# Patient Record
Sex: Female | Born: 1985 | Race: Black or African American | Hispanic: No | Marital: Single | Smoking: Former smoker
Health system: Southern US, Community
[De-identification: ages and names within clinical notes are randomized; demographics above are authoritative.]

## PROBLEM LIST (undated history)

## (undated) DIAGNOSIS — I472 Ventricular tachycardia, unspecified: Secondary | ICD-10-CM

## (undated) DIAGNOSIS — R87619 Unspecified abnormal cytological findings in specimens from cervix uteri: Secondary | ICD-10-CM

## (undated) DIAGNOSIS — R102 Pelvic and perineal pain: Secondary | ICD-10-CM

## (undated) DIAGNOSIS — IMO0002 Reserved for concepts with insufficient information to code with codable children: Secondary | ICD-10-CM

## (undated) DIAGNOSIS — B009 Herpesviral infection, unspecified: Secondary | ICD-10-CM

## (undated) DIAGNOSIS — G43909 Migraine, unspecified, not intractable, without status migrainosus: Secondary | ICD-10-CM

## (undated) DIAGNOSIS — K219 Gastro-esophageal reflux disease without esophagitis: Secondary | ICD-10-CM

## (undated) DIAGNOSIS — N83209 Unspecified ovarian cyst, unspecified side: Secondary | ICD-10-CM

## (undated) DIAGNOSIS — A749 Chlamydial infection, unspecified: Secondary | ICD-10-CM

## (undated) HISTORY — DX: Migraine, unspecified, not intractable, without status migrainosus: G43.909

## (undated) HISTORY — DX: Herpesviral infection, unspecified: B00.9

## (undated) HISTORY — DX: Ventricular tachycardia: I47.2

## (undated) HISTORY — DX: Pelvic and perineal pain: R10.2

## (undated) HISTORY — DX: Ventricular tachycardia, unspecified: I47.20

## (undated) HISTORY — DX: Gastro-esophageal reflux disease without esophagitis: K21.9

## (undated) HISTORY — DX: Reserved for concepts with insufficient information to code with codable children: IMO0002

## (undated) HISTORY — DX: Unspecified abnormal cytological findings in specimens from cervix uteri: R87.619

## (undated) HISTORY — PX: MOUTH SURGERY: SHX715

---
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
## 2007-11-20 ENCOUNTER — Emergency Department (HOSPITAL_COMMUNITY): Admission: EM | Admit: 2007-11-20 | Discharge: 2007-11-20 | Payer: Self-pay | Admitting: Emergency Medicine

## 2007-12-24 ENCOUNTER — Emergency Department (HOSPITAL_COMMUNITY): Admission: EM | Admit: 2007-12-24 | Discharge: 2007-12-24 | Payer: Self-pay | Admitting: Emergency Medicine

## 2008-02-26 ENCOUNTER — Emergency Department (HOSPITAL_COMMUNITY): Admission: EM | Admit: 2008-02-26 | Discharge: 2008-02-26 | Payer: Self-pay | Admitting: Physician Assistant

## 2008-03-10 ENCOUNTER — Emergency Department (HOSPITAL_COMMUNITY): Admission: EM | Admit: 2008-03-10 | Discharge: 2008-03-10 | Payer: Self-pay | Admitting: Emergency Medicine

## 2008-03-17 ENCOUNTER — Emergency Department (HOSPITAL_COMMUNITY): Admission: EM | Admit: 2008-03-17 | Discharge: 2008-03-18 | Payer: Self-pay | Admitting: Emergency Medicine

## 2008-03-25 ENCOUNTER — Inpatient Hospital Stay (HOSPITAL_COMMUNITY): Admission: AD | Admit: 2008-03-25 | Discharge: 2008-03-25 | Payer: Self-pay | Admitting: Obstetrics & Gynecology

## 2008-04-21 ENCOUNTER — Inpatient Hospital Stay (HOSPITAL_COMMUNITY): Admission: AD | Admit: 2008-04-21 | Discharge: 2008-04-21 | Payer: Self-pay | Admitting: Obstetrics

## 2008-05-08 ENCOUNTER — Inpatient Hospital Stay (HOSPITAL_COMMUNITY): Admission: AD | Admit: 2008-05-08 | Discharge: 2008-05-08 | Payer: Self-pay | Admitting: Obstetrics

## 2008-05-14 ENCOUNTER — Inpatient Hospital Stay (HOSPITAL_COMMUNITY): Admission: AD | Admit: 2008-05-14 | Discharge: 2008-05-14 | Payer: Self-pay | Admitting: Obstetrics

## 2008-05-19 ENCOUNTER — Inpatient Hospital Stay (HOSPITAL_COMMUNITY): Admission: AD | Admit: 2008-05-19 | Discharge: 2008-05-19 | Payer: Self-pay | Admitting: Obstetrics

## 2008-06-10 ENCOUNTER — Inpatient Hospital Stay (HOSPITAL_COMMUNITY): Admission: AD | Admit: 2008-06-10 | Discharge: 2008-06-10 | Payer: Self-pay | Admitting: Obstetrics

## 2008-07-27 ENCOUNTER — Ambulatory Visit (HOSPITAL_COMMUNITY): Admission: RE | Admit: 2008-07-27 | Discharge: 2008-07-27 | Payer: Self-pay | Admitting: Obstetrics

## 2008-08-13 ENCOUNTER — Inpatient Hospital Stay (HOSPITAL_COMMUNITY): Admission: AD | Admit: 2008-08-13 | Discharge: 2008-08-13 | Payer: Self-pay | Admitting: Obstetrics

## 2008-09-13 ENCOUNTER — Inpatient Hospital Stay (HOSPITAL_COMMUNITY): Admission: AD | Admit: 2008-09-13 | Discharge: 2008-09-13 | Payer: Self-pay | Admitting: Obstetrics

## 2008-09-18 ENCOUNTER — Ambulatory Visit: Payer: Self-pay | Admitting: Obstetrics and Gynecology

## 2008-09-18 ENCOUNTER — Inpatient Hospital Stay (HOSPITAL_COMMUNITY): Admission: AD | Admit: 2008-09-18 | Discharge: 2008-09-18 | Payer: Self-pay | Admitting: Obstetrics

## 2008-10-11 ENCOUNTER — Ambulatory Visit: Payer: Self-pay | Admitting: Advanced Practice Midwife

## 2008-10-11 ENCOUNTER — Inpatient Hospital Stay (HOSPITAL_COMMUNITY): Admission: AD | Admit: 2008-10-11 | Discharge: 2008-10-11 | Payer: Self-pay | Admitting: Obstetrics

## 2008-10-12 ENCOUNTER — Inpatient Hospital Stay (HOSPITAL_COMMUNITY): Admission: AD | Admit: 2008-10-12 | Discharge: 2008-10-12 | Payer: Self-pay | Admitting: Obstetrics

## 2008-10-21 ENCOUNTER — Inpatient Hospital Stay (HOSPITAL_COMMUNITY): Admission: AD | Admit: 2008-10-21 | Discharge: 2008-10-21 | Payer: Self-pay | Admitting: Obstetrics

## 2008-11-23 ENCOUNTER — Inpatient Hospital Stay (HOSPITAL_COMMUNITY): Admission: RE | Admit: 2008-11-23 | Discharge: 2008-11-25 | Payer: Self-pay | Admitting: Obstetrics

## 2009-03-11 ENCOUNTER — Emergency Department (HOSPITAL_COMMUNITY): Admission: EM | Admit: 2009-03-11 | Discharge: 2009-03-11 | Payer: Self-pay | Admitting: Emergency Medicine

## 2009-04-11 ENCOUNTER — Emergency Department (HOSPITAL_COMMUNITY): Admission: EM | Admit: 2009-04-11 | Discharge: 2009-04-11 | Payer: Self-pay | Admitting: Emergency Medicine

## 2009-08-05 ENCOUNTER — Emergency Department (HOSPITAL_COMMUNITY): Admission: EM | Admit: 2009-08-05 | Discharge: 2009-08-05 | Payer: Self-pay | Admitting: Emergency Medicine

## 2009-08-18 ENCOUNTER — Emergency Department (HOSPITAL_COMMUNITY): Admission: EM | Admit: 2009-08-18 | Discharge: 2009-08-18 | Payer: Self-pay | Admitting: Emergency Medicine

## 2009-09-21 ENCOUNTER — Inpatient Hospital Stay (HOSPITAL_COMMUNITY): Admission: AD | Admit: 2009-09-21 | Discharge: 2009-09-21 | Payer: Self-pay | Admitting: Obstetrics & Gynecology

## 2009-11-24 ENCOUNTER — Inpatient Hospital Stay (HOSPITAL_COMMUNITY): Admission: AD | Admit: 2009-11-24 | Discharge: 2009-11-24 | Payer: Self-pay | Admitting: Obstetrics and Gynecology

## 2010-02-18 ENCOUNTER — Emergency Department (HOSPITAL_COMMUNITY): Admission: EM | Admit: 2010-02-18 | Discharge: 2010-02-18 | Payer: Self-pay | Admitting: Emergency Medicine

## 2010-07-25 ENCOUNTER — Inpatient Hospital Stay (HOSPITAL_COMMUNITY)
Admission: AD | Admit: 2010-07-25 | Discharge: 2010-07-26 | Disposition: A | Discharge status: Home / Self Care | Payer: Self-pay | Source: Ambulatory Visit | Attending: Obstetrics & Gynecology | Admitting: Obstetrics & Gynecology

## 2010-07-25 DIAGNOSIS — N949 Unspecified condition associated with female genital organs and menstrual cycle: Secondary | ICD-10-CM

## 2010-07-25 DIAGNOSIS — B3731 Acute candidiasis of vulva and vagina: Secondary | ICD-10-CM | POA: Insufficient documentation

## 2010-07-25 DIAGNOSIS — B373 Candidiasis of vulva and vagina: Secondary | ICD-10-CM

## 2010-07-25 LAB — WET PREP, GENITAL: Yeast Wet Prep HPF POC: NONE SEEN

## 2010-07-25 LAB — CBC
MCH: 30.9 pg (ref 26.0–34.0)
Platelets: 263 10*3/uL (ref 150–400)

## 2010-07-25 LAB — POCT PREGNANCY, URINE: Preg Test, Ur: NEGATIVE

## 2010-07-26 LAB — GC/CHLAMYDIA PROBE AMP, GENITAL: GC Probe Amp, Genital: NEGATIVE

## 2010-07-27 LAB — POCT URINALYSIS DIPSTICK
Glucose, UA: NEGATIVE mg/dL
Hgb urine dipstick: NEGATIVE
Ketones, ur: NEGATIVE mg/dL
Specific Gravity, Urine: 1.01 (ref 1.005–1.030)

## 2010-07-27 LAB — POCT PREGNANCY, URINE: Preg Test, Ur: NEGATIVE

## 2010-08-01 LAB — URINALYSIS, ROUTINE W REFLEX MICROSCOPIC
Glucose, UA: NEGATIVE mg/dL
Nitrite: NEGATIVE
Specific Gravity, Urine: >1.03 — ABNORMAL HIGH (ref 1.005–1.030)
pH: 6 (ref 5.0–8.0)

## 2010-08-01 LAB — GC/CHLAMYDIA PROBE AMP, GENITAL
Chlamydia, DNA Probe: NEGATIVE
GC Probe Amp, Genital: NEGATIVE

## 2010-08-01 LAB — CBC
HCT: 37.3 % (ref 36.0–46.0)
MCHC: 34.4 g/dL (ref 30.0–36.0)
MCV: 93 fL (ref 78.0–100.0)
WBC: 4.8 10*3/uL (ref 4.0–10.5)

## 2010-08-01 LAB — WET PREP, GENITAL
Clue Cells Wet Prep HPF POC: NONE SEEN
Trich, Wet Prep: NONE SEEN
Yeast Wet Prep HPF POC: NONE SEEN

## 2010-08-01 LAB — URINE MICROSCOPIC-ADD ON

## 2010-08-02 LAB — RAPID STREP SCREEN (MED CTR MEBANE ONLY): Streptococcus, Group A Screen (Direct): NEGATIVE

## 2010-08-06 LAB — WET PREP, GENITAL

## 2010-08-06 LAB — POCT PREGNANCY, URINE: Preg Test, Ur: NEGATIVE

## 2010-08-06 LAB — URINALYSIS, ROUTINE W REFLEX MICROSCOPIC
Nitrite: NEGATIVE
Protein, ur: NEGATIVE mg/dL
Urobilinogen, UA: 0.2 mg/dL (ref 0.0–1.0)

## 2010-08-12 ENCOUNTER — Emergency Department (HOSPITAL_COMMUNITY)
Admission: EM | Admit: 2010-08-12 | Discharge: 2010-08-12 | Disposition: A | Discharge status: Home / Self Care | Payer: Self-pay | Attending: Emergency Medicine | Admitting: Emergency Medicine

## 2010-08-12 DIAGNOSIS — J3489 Other specified disorders of nose and nasal sinuses: Secondary | ICD-10-CM | POA: Insufficient documentation

## 2010-08-12 DIAGNOSIS — R05 Cough: Secondary | ICD-10-CM | POA: Insufficient documentation

## 2010-08-12 DIAGNOSIS — J069 Acute upper respiratory infection, unspecified: Secondary | ICD-10-CM | POA: Insufficient documentation

## 2010-08-12 DIAGNOSIS — J309 Allergic rhinitis, unspecified: Secondary | ICD-10-CM | POA: Insufficient documentation

## 2010-08-12 DIAGNOSIS — R6889 Other general symptoms and signs: Secondary | ICD-10-CM | POA: Insufficient documentation

## 2010-08-12 DIAGNOSIS — R059 Cough, unspecified: Secondary | ICD-10-CM | POA: Insufficient documentation

## 2010-08-20 LAB — RPR: RPR Ser Ql: NONREACTIVE

## 2010-08-20 LAB — CBC
HCT: 29.4 % — ABNORMAL LOW (ref 36.0–46.0)
HCT: 34 % — ABNORMAL LOW (ref 36.0–46.0)
Hemoglobin: 11.7 g/dL — ABNORMAL LOW (ref 12.0–15.0)
Hemoglobin: 9.8 g/dL — ABNORMAL LOW (ref 12.0–15.0)
MCV: 94.1 fL (ref 78.0–100.0)
RBC: 3.61 MIL/uL — ABNORMAL LOW (ref 3.87–5.11)
RDW: 14.1 % (ref 11.5–15.5)
WBC: 7.3 10*3/uL (ref 4.0–10.5)

## 2010-08-21 LAB — WET PREP, GENITAL
Clue Cells Wet Prep HPF POC: NONE SEEN
Trich, Wet Prep: NONE SEEN

## 2010-08-21 LAB — URINALYSIS, ROUTINE W REFLEX MICROSCOPIC
Bilirubin Urine: NEGATIVE
Hgb urine dipstick: NEGATIVE
Specific Gravity, Urine: 1.015 (ref 1.005–1.030)
Urobilinogen, UA: 0.2 mg/dL (ref 0.0–1.0)

## 2010-08-22 LAB — URINALYSIS, ROUTINE W REFLEX MICROSCOPIC
Protein, ur: NEGATIVE mg/dL
Urobilinogen, UA: 0.2 mg/dL (ref 0.0–1.0)

## 2010-08-23 LAB — URINALYSIS, ROUTINE W REFLEX MICROSCOPIC
Bilirubin Urine: NEGATIVE
Glucose, UA: NEGATIVE mg/dL
Ketones, ur: NEGATIVE mg/dL
pH: 6 (ref 5.0–8.0)

## 2010-08-23 LAB — WET PREP, GENITAL: Yeast Wet Prep HPF POC: NONE SEEN

## 2010-08-28 LAB — URINALYSIS, ROUTINE W REFLEX MICROSCOPIC
Bilirubin Urine: NEGATIVE
Glucose, UA: NEGATIVE mg/dL
Glucose, UA: NEGATIVE mg/dL
Hgb urine dipstick: NEGATIVE
Hgb urine dipstick: NEGATIVE
Specific Gravity, Urine: 1.01 (ref 1.005–1.030)
Specific Gravity, Urine: 1.025 (ref 1.005–1.030)
Urobilinogen, UA: 0.2 mg/dL (ref 0.0–1.0)
Urobilinogen, UA: 0.2 mg/dL (ref 0.0–1.0)

## 2010-08-28 LAB — WET PREP, GENITAL
Trich, Wet Prep: NONE SEEN
Yeast Wet Prep HPF POC: NONE SEEN

## 2010-08-28 LAB — GC/CHLAMYDIA PROBE AMP, GENITAL: GC Probe Amp, Genital: NEGATIVE

## 2010-11-10 ENCOUNTER — Inpatient Hospital Stay (HOSPITAL_COMMUNITY)
Admission: AD | Admit: 2010-11-10 | Discharge: 2010-11-10 | Disposition: A | Discharge status: Home / Self Care | Payer: Self-pay | Source: Ambulatory Visit | Attending: Family Medicine | Admitting: Family Medicine

## 2010-11-10 DIAGNOSIS — N390 Urinary tract infection, site not specified: Secondary | ICD-10-CM | POA: Insufficient documentation

## 2010-11-10 DIAGNOSIS — R109 Unspecified abdominal pain: Secondary | ICD-10-CM | POA: Insufficient documentation

## 2010-11-10 DIAGNOSIS — B9689 Other specified bacterial agents as the cause of diseases classified elsewhere: Secondary | ICD-10-CM | POA: Insufficient documentation

## 2010-11-10 DIAGNOSIS — O2390 Unspecified genitourinary tract infection in pregnancy, unspecified trimester: Secondary | ICD-10-CM | POA: Insufficient documentation

## 2010-11-10 DIAGNOSIS — B3731 Acute candidiasis of vulva and vagina: Secondary | ICD-10-CM | POA: Insufficient documentation

## 2010-11-10 DIAGNOSIS — B373 Candidiasis of vulva and vagina: Secondary | ICD-10-CM | POA: Insufficient documentation

## 2010-11-10 DIAGNOSIS — N760 Acute vaginitis: Secondary | ICD-10-CM

## 2010-11-10 DIAGNOSIS — A499 Bacterial infection, unspecified: Secondary | ICD-10-CM | POA: Insufficient documentation

## 2010-11-10 LAB — URINALYSIS, ROUTINE W REFLEX MICROSCOPIC
Glucose, UA: NEGATIVE mg/dL
Ketones, ur: 15 mg/dL — AB
Specific Gravity, Urine: >1.03 — ABNORMAL HIGH (ref 1.005–1.030)
pH: 6 (ref 5.0–8.0)

## 2010-11-10 LAB — GC/CHLAMYDIA PROBE AMP, GENITAL: Chlamydia, DNA Probe: NEGATIVE

## 2010-11-10 LAB — URINE MICROSCOPIC-ADD ON

## 2010-11-11 LAB — URINE CULTURE

## 2010-12-11 ENCOUNTER — Inpatient Hospital Stay (INDEPENDENT_AMBULATORY_CARE_PROVIDER_SITE_OTHER)
Admission: RE | Admit: 2010-12-11 | Discharge: 2010-12-11 | Disposition: A | Discharge status: Home / Self Care | Payer: Self-pay | Source: Ambulatory Visit | Attending: Family Medicine | Admitting: Family Medicine

## 2010-12-11 DIAGNOSIS — J019 Acute sinusitis, unspecified: Secondary | ICD-10-CM

## 2011-02-09 LAB — URINALYSIS, ROUTINE W REFLEX MICROSCOPIC
Glucose, UA: NEGATIVE
Nitrite: NEGATIVE
Protein, ur: NEGATIVE
pH: 7

## 2011-02-09 LAB — WET PREP, GENITAL
Clue Cells Wet Prep HPF POC: NONE SEEN
Trich, Wet Prep: NONE SEEN
WBC, Wet Prep HPF POC: NONE SEEN

## 2011-02-09 LAB — HCG, QUANTITATIVE, PREGNANCY: hCG, Beta Chain, Quant, S: <2

## 2011-02-09 LAB — CBC
Hemoglobin: 12.9
MCHC: 33.8
RBC: 4.08
RDW: 12.9

## 2011-02-09 LAB — COMPREHENSIVE METABOLIC PANEL
ALT: 16
AST: 23
Alkaline Phosphatase: 64
Calcium: 8.8
GFR calc Af Amer: >60
Glucose, Bld: 93
Potassium: 4.3
Sodium: 137
Total Protein: 6.6

## 2011-02-09 LAB — DIFFERENTIAL
Basophils Relative: 0
Eosinophils Absolute: 0.1
Eosinophils Relative: 2
Lymphs Abs: 1.7
Monocytes Absolute: 0.4
Monocytes Relative: 8
Neutrophils Relative %: 56

## 2011-02-09 LAB — PREGNANCY, URINE: Preg Test, Ur: NEGATIVE

## 2011-02-13 LAB — URINALYSIS, ROUTINE W REFLEX MICROSCOPIC
Bilirubin Urine: NEGATIVE
Glucose, UA: NEGATIVE
Glucose, UA: NEGATIVE
Hgb urine dipstick: NEGATIVE
Hgb urine dipstick: NEGATIVE
Ketones, ur: NEGATIVE
Ketones, ur: NEGATIVE
Protein, ur: NEGATIVE
Urobilinogen, UA: 0.2
pH: 6.5

## 2011-02-13 LAB — WET PREP, GENITAL
Clue Cells Wet Prep HPF POC: NONE SEEN
Yeast Wet Prep HPF POC: NONE SEEN

## 2011-02-13 LAB — GC/CHLAMYDIA PROBE AMP, GENITAL: GC Probe Amp, Genital: NEGATIVE

## 2011-02-13 LAB — RAPID STREP SCREEN (MED CTR MEBANE ONLY): Streptococcus, Group A Screen (Direct): NEGATIVE

## 2011-02-16 LAB — URINALYSIS, ROUTINE W REFLEX MICROSCOPIC
Bilirubin Urine: NEGATIVE
Glucose, UA: NEGATIVE mg/dL
Glucose, UA: NEGATIVE mg/dL
Hgb urine dipstick: NEGATIVE
Ketones, ur: 15 mg/dL — AB
Ketones, ur: 15 mg/dL — AB
Protein, ur: NEGATIVE mg/dL
Protein, ur: NEGATIVE mg/dL
Urobilinogen, UA: 0.2 mg/dL (ref 0.0–1.0)
pH: 8.5 — ABNORMAL HIGH (ref 5.0–8.0)

## 2011-03-10 ENCOUNTER — Inpatient Hospital Stay (HOSPITAL_COMMUNITY)
Admission: AD | Admit: 2011-03-10 | Discharge: 2011-03-10 | Disposition: A | Discharge status: Home / Self Care | Payer: BC Managed Care – PPO | Source: Ambulatory Visit | Attending: Obstetrics and Gynecology | Admitting: Obstetrics and Gynecology

## 2011-03-10 ENCOUNTER — Encounter (HOSPITAL_COMMUNITY): Payer: Self-pay | Admitting: *Deleted

## 2011-03-10 DIAGNOSIS — L293 Anogenital pruritus, unspecified: Secondary | ICD-10-CM | POA: Insufficient documentation

## 2011-03-10 DIAGNOSIS — A6000 Herpesviral infection of urogenital system, unspecified: Secondary | ICD-10-CM | POA: Insufficient documentation

## 2011-03-10 HISTORY — DX: Unspecified ovarian cyst, unspecified side: N83.209

## 2011-03-10 LAB — WET PREP, GENITAL: Clue Cells Wet Prep HPF POC: NONE SEEN

## 2011-03-10 MED ORDER — VALACYCLOVIR HCL 1 G PO TABS: 1000.0000 mg | ORAL_TABLET | Freq: Two times a day (BID) | ORAL | Status: DC

## 2011-03-10 NOTE — Progress Notes (Signed)
Written and verbal d/c instructions given and understanding voiced. 

## 2011-03-10 NOTE — Progress Notes (Signed)
Elsie Ra CNM in and spec exam done. Wet prep, GC/Chlam, and herpes culture obtained. Pt tol well

## 2011-03-10 NOTE — Progress Notes (Signed)
Pt is G3P1. Hx yeast infection this past wk. Treated. Now having burning and itching upper vag area. Was told this past wk tested positive for herpes antibodies when had blood work. Antibodies for type I and II

## 2011-03-10 NOTE — ED Provider Notes (Signed)
History     Chief Complaint  Patient presents with  . Vaginal Itching  . Vaginal Discharge   HPI Pt presents with c/o of persistent external genitalia itching and burning.  She reports she was treated for candidiasis approximately 10 days ago after being seen in the office.  She reports her symptoms were resolving, however she began having increased external itching, burning and irritation 2 days ago.  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 She reports history of recurrent yeast over the past several years.  She denies fever.  She has no known exposure to HSV.  She has not treated her itching.   Pertinent Gynecological History: Menses: no menses at present due to Implanon use Bleeding: none  Contraception: Implanon DES exposure: denies Blood transfusions: none Sexually transmitted diseases: recent diagnosis: HSV; serum Previous GYN Procedures: colposcopy Last mammogram: N/A Date: N/A Last pap: normal Date: 2011   Past Medical History  Diagnosis Date  . Ovarian cyst     Past Surgical History  Procedure Date  . Colposcopy 2008    has had procedure done couple times  . No past surgeries     Family History  Problem Relation Age of Onset  . Birth defects Sister   . Hypertension Maternal Grandmother   . Stroke Maternal Grandmother   . Diabetes Maternal Grandfather   . Diabetes Paternal Grandmother     History  Substance Use Topics  . Smoking status: Never Smoker   . Smokeless tobacco: Not on file  . Alcohol Use: 0.6 oz/week    1 Glasses of wine per week     occ wine    Allergies:  Allergies  Allergen Reactions  . Rondec (Chlorpheniramine-Pseudoeph) Other (See Comments)    Hallucinations.  Not sure which formulation of rondec (brompheniramine vs. Chlorpheniramine)    Prescriptions prior to admission  Medication Sig Dispense Refill  . acetaminophen (TYLENOL) 500 MG tablet Take 1,000 mg by  mouth every 6 (six) hours as needed. For pain       . polyethylene glycol (MIRALAX / GLYCOLAX) packet Take 17 g by mouth daily.        . Vitamin D, Ergocalciferol, (DRISDOL) 50000 UNITS CAPS Take 50,000 Units by mouth 2 (two) times a week.          Review of Systems  Constitutional: Negative.   HENT: Negative.   Eyes: Negative.   Cardiovascular: Negative.   Gastrointestinal: Negative.   Genitourinary: Negative.   Musculoskeletal: Negative.   Skin: Positive for itching and rash.  Neurological: Negative.   Endo/Heme/Allergies: Negative.   Psychiatric/Behavioral: Negative.    Physical Exam   Blood pressure 118/70, pulse 76, temperature 99 F (37.2 C), temperature source Oral, resp. rate 20, height 5\' 4"  (1.626 m), weight 98.941 kg (218 lb 2 oz).  Physical Exam  Constitutional: She is oriented to person, place, and time. She appears well-developed and well-nourished.  HENT:  Head: Normocephalic and atraumatic.  Right Ear: External ear normal.  Left Ear: External ear normal.  Eyes: Conjunctivae are normal. Pupils are equal, round, and reactive to light.  Neck: Normal range of motion. Neck supple. No thyromegaly present.  Cardiovascular: Normal rate, regular rhythm and intact distal pulses.   Respiratory: Effort normal and breath sounds normal.  GI: Soft. Bowel sounds are normal.  Genitourinary: Vagina normal and uterus normal. No vaginal discharge found.  Musculoskeletal: Normal range of motion.  Neurological: She  is alert and oriented to person, place, and time. She has normal reflexes.  Skin: Skin is warm and dry. Rash noted.       Ext genitalia with multiple vesicular lesions noted on labia majora, labia minor and clitoral hood.  Psychiatric: She has a normal mood and affect. Her behavior is normal. Judgment and thought content normal.    MAU Course  Procedures Results for orders placed during the hospital encounter of 03/10/11 (from the past 24 hour(s))  WET PREP, GENITAL      Status: Abnormal   Collection Time   03/10/11  8:55 PM      Component Value Range   Yeast, Wet Prep NONE SEEN  NONE SEEN    Trich, Wet Prep NONE SEEN  NONE SEEN    Clue Cells, Wet Prep NONE SEEN  NONE SEEN    WBC, Wet Prep HPF POC MODERATE (*) NONE SEEN     Assessment and Plan  Herpes genitalis  Discussed findings with pt. Rx Valtrex 1000mg , bid x 10 days with no refill given and instructions reviewed. Rec Align probiotics to help with recurrent yeast.   Pt given list of resources for education. Follow up as scheduled at [facility] and call for further refills of Valtrex as needed.  Jamareon Shimel O. 03/10/2011, 9:53 PM

## 2011-03-10 NOTE — ED Notes (Signed)
Elsie Ra CNM in to see pt

## 2011-03-10 NOTE — Progress Notes (Signed)
Pt states, " I was treated for a yeast infection last Wed, I took Diflucan and used 3 days OTC cream. I am still having itching at the top, and I have a line of bumps.

## 2011-03-12 LAB — HERPES SIMPLEX VIRUS CULTURE
Culture: NOT DETECTED
Special Requests: NORMAL

## 2011-05-15 ENCOUNTER — Emergency Department (HOSPITAL_COMMUNITY): Payer: BC Managed Care – PPO

## 2011-05-15 ENCOUNTER — Other Ambulatory Visit: Payer: Self-pay

## 2011-05-15 ENCOUNTER — Emergency Department (HOSPITAL_COMMUNITY)
Admission: EM | Admit: 2011-05-15 | Discharge: 2011-05-15 | Disposition: A | Discharge status: Home / Self Care | Payer: BC Managed Care – PPO | Attending: Emergency Medicine | Admitting: Emergency Medicine

## 2011-05-15 ENCOUNTER — Encounter (HOSPITAL_COMMUNITY): Payer: Self-pay | Admitting: *Deleted

## 2011-05-15 DIAGNOSIS — R059 Cough, unspecified: Secondary | ICD-10-CM | POA: Insufficient documentation

## 2011-05-15 DIAGNOSIS — J40 Bronchitis, not specified as acute or chronic: Secondary | ICD-10-CM | POA: Insufficient documentation

## 2011-05-15 DIAGNOSIS — R079 Chest pain, unspecified: Secondary | ICD-10-CM | POA: Insufficient documentation

## 2011-05-15 DIAGNOSIS — R599 Enlarged lymph nodes, unspecified: Secondary | ICD-10-CM | POA: Insufficient documentation

## 2011-05-15 DIAGNOSIS — R6883 Chills (without fever): Secondary | ICD-10-CM | POA: Insufficient documentation

## 2011-05-15 DIAGNOSIS — R05 Cough: Secondary | ICD-10-CM | POA: Insufficient documentation

## 2011-05-15 LAB — RAPID STREP SCREEN (MED CTR MEBANE ONLY): Streptococcus, Group A Screen (Direct): NEGATIVE

## 2011-05-15 MED ORDER — BENZONATATE 100 MG PO CAPS: 100.0000 mg | ORAL_CAPSULE | Freq: Three times a day (TID) | ORAL | Status: AC

## 2011-05-15 MED ORDER — AEROCHAMBER MAX W/MASK SMALL MISC
1.0000 | Freq: Once | Status: AC
  Administered 2011-05-15: 1
  Filled 2011-05-15 (×2): qty 1

## 2011-05-15 MED ORDER — ALBUTEROL SULFATE HFA 108 (90 BASE) MCG/ACT IN AERS
2.0000 | INHALATION_SPRAY | RESPIRATORY_TRACT | Status: DC | PRN
  Administered 2011-05-15: 2 via RESPIRATORY_TRACT
  Filled 2011-05-15: qty 6.7

## 2011-05-15 NOTE — ED Notes (Signed)
Strep test by emt r Ayline Dingus

## 2011-05-15 NOTE — ED Provider Notes (Signed)
History     CSN: [id number]  Arrival date & time 05/15/11  1919   First MD Initiated Contact with Patient 05/15/11 2110      Chief Complaint  Patient presents with  . Chills    (Consider location/radiation/quality/duration/timing/severity/associated sxs/prior treatment) HPI Comments: Patient with cough for one week. She states that she has chest pain with cough. Patient denies fever, upper respiratory tract symptoms, nausea, vomiting, or diarrhea. Patient is only taking a multivitamin. No other treatments prior.  Patient is a 26 y.o. female presenting with cough. The history is provided by the patient.  Cough This is a new problem. The current episode started more than 1 week ago. The cough is productive of sputum. There has been no fever. Associated symptoms include chest pain. Pertinent negatives include no chills, no ear congestion, no ear pain, no headaches, no rhinorrhea, no sore throat, no myalgias, no shortness of breath and no eye redness. Her past medical history is significant for bronchitis. Her past medical history does not include asthma.    Past Medical History  Diagnosis Date  . Ovarian cyst     Past Surgical History  Procedure Date  . Colposcopy 2008    has had procedure done couple times  . No past surgeries     Family History  Problem Relation Age of Onset  . Birth defects Sister   . Hypertension Maternal Grandmother   . Stroke Maternal Grandmother   . Diabetes Maternal Grandfather   . Diabetes Paternal Grandmother     History  Substance Use Topics  . Smoking status: Never Smoker   . Smokeless tobacco: Not on file  . Alcohol Use: 0.6 oz/week    1 Glasses of wine per week     occ wine    OB History    Grav Para Term Preterm Abortions TAB SAB Ect Mult Living   3 1 1  0 2 1 1  0 0 1      Review of Systems  Constitutional: Negative for fever and chills.  HENT: Negative for ear pain, sore throat and rhinorrhea.   Eyes: Negative for discharge and  redness.  Respiratory: Positive for cough. Negative for shortness of breath.   Cardiovascular: Positive for chest pain.  Gastrointestinal: Negative for nausea, vomiting, abdominal pain, diarrhea and constipation.  Genitourinary: Negative for dysuria and hematuria.  Musculoskeletal: Negative for myalgias.  Skin: Negative for rash.  Neurological: Negative for headaches.  Hematological: Positive for adenopathy.  Psychiatric/Behavioral: Negative for confusion.    Allergies  Rondec  Home Medications   Current Outpatient Rx  Name Route Sig Dispense Refill  . THERA M PLUS PO TABS Oral Take 1 tablet by mouth daily.      Marland Kitchen VITAMIN D (ERGOCALCIFEROL) 50000 UNITS PO CAPS Oral Take 50,000 Units by mouth 2 (two) times a week.      Marland Kitchen BENZONATATE 100 MG PO CAPS Oral Take 1 capsule (100 mg total) by mouth every 8 (eight) hours. 15 capsule 0    BP 105/58  Pulse 78  Temp(Src) 98.5 F (36.9 C) (Oral)  Resp 16  SpO2 99%  Physical Exam  Nursing note and vitals reviewed. Constitutional: She is oriented to person, place, and time. She appears well-developed and well-nourished.  HENT:  Head: Normocephalic and atraumatic.  Right Ear: Tympanic membrane, external ear and ear canal normal.  Left Ear: Tympanic membrane, external ear and ear canal normal.  Nose: Nose normal.  Mouth/Throat: Uvula is midline, oropharynx is clear and moist and  mucous membranes are normal.  Eyes: Pupils are equal, round, and reactive to light. Right eye exhibits no discharge. Left eye exhibits no discharge.  Neck: Normal range of motion. Neck supple.  Cardiovascular: Normal rate and regular rhythm.  Exam reveals no gallop and no friction rub.   No murmur heard. Pulmonary/Chest: Effort normal and breath sounds normal. No respiratory distress. She has no wheezes.  Abdominal: Soft. There is no tenderness. There is no rebound and no guarding.  Musculoskeletal: Normal range of motion.  Lymphadenopathy:    She has cervical  adenopathy.  Neurological: She is alert and oriented to person, place, and time.  Skin: Skin is warm and dry. No rash noted.  Psychiatric: She has a normal mood and affect.    ED Course  Procedures (including critical care time)   Labs Reviewed  RAPID STREP SCREEN   Dg Chest 2 View  05/15/2011  *RADIOLOGY REPORT*  Clinical Data: Chest pain.  Shortness of breath.  Tobacco use.  CHEST - 2 VIEW  Comparison: 04/11/2009  Findings: Cardiac and mediastinal contours appear normal.  The lungs appear clear.  No pleural effusion is identified.  IMPRESSION:  No significant abnormality identified.  Original Report Authenticated By: Dellia Cloud, M.D.     1. Bronchitis    9:52 PM patient informed of strep and chest x-ray results. Patient counseled on supportive care for viral URI and s/s to return including worsening symptoms, persistent fever, persistent vomiting, or if they have any other concerns.  Urged to see PCP if symptoms persist for more than 3 days. Patient verbalizes understanding and agrees with plan.   9:53 PM Patient counseled on use of albuterol HFA.  Told to use 1-2 puffs q 4 hours as needed for SOB.      MDM  Patient with likely viral bronchitis. No concern for PNA given normal lung exam/x-ray. Antibiotics not indicated. Conservative therapy indicated. Chest pain is likely due to cough. No concerning risk factors for PE. Patient appears well and is stable for discharge home.      Medical screening examination/treatment/procedure(s) were performed by non-physician practitioner and as supervising physician I was immediately available for consultation/collaboration. Osvaldo Human, M.D.     Carolee Rota, [place] 05/15/11 2153  Carleene Cooper III, MD 05/16/11 1323

## 2011-05-15 NOTE — ED Notes (Signed)
Cold for one week with throat pain exterior and some chest congestion,  No respiratory difficulty at present

## 2011-07-30 ENCOUNTER — Emergency Department (HOSPITAL_COMMUNITY)
Admission: EM | Admit: 2011-07-30 | Discharge: 2011-07-30 | Disposition: A | Discharge status: Home / Self Care | Payer: Self-pay | Source: Home / Self Care

## 2011-07-30 ENCOUNTER — Encounter (HOSPITAL_COMMUNITY): Payer: Self-pay

## 2011-07-30 DIAGNOSIS — J069 Acute upper respiratory infection, unspecified: Secondary | ICD-10-CM

## 2011-07-30 MED ORDER — FLUTICASONE PROPIONATE 50 MCG/ACT NA SUSP: 2.0000 | Freq: Every day | NASAL | Status: DC

## 2011-07-30 NOTE — Discharge Instructions (Signed)
Continue Tylenol Cold as needed for congestion and discomfort. Begin the prescription nasal spray today to help with nasal congestion and swelling. Increase fluids and rest.  Upper Respiratory Infection, Adult An upper respiratory infection (URI) is also known as the common cold. It is often caused by a type of germ (virus). Colds are easily spread (contagious). You can pass it to others by kissing, coughing, sneezing, or drinking out of the same glass. Usually, you get better in 1 or 2 weeks.  HOME CARE   Only take medicine as told by your doctor.   Use a warm mist humidifier or breathe in steam from a hot shower.   Drink enough water and fluids to keep your pee (urine) clear or pale yellow.   Get plenty of rest.   Return to work when your temperature is back to normal or as told by your doctor. You may use a face mask and wash your hands to stop your cold from spreading.  GET HELP RIGHT AWAY IF:   After the first few days, you feel you are getting worse.   You have questions about your medicine.   You have chills, shortness of breath, or brown or red spit (mucus).   You have yellow or brown snot (nasal discharge) or pain in the face, especially when you bend forward.   You have a fever, puffy (swollen) neck, pain when you swallow, or white spots in the back of your throat.   You have a bad headache, ear pain, sinus pain, or chest pain.   You have a high-pitched whistling sound when you breathe in and out (wheezing).   You have a lasting cough or cough up blood.   You have sore muscles or a stiff neck.  MAKE SURE YOU:   Understand these instructions.   Will watch your condition.   Will get help right away if you are not doing well or get worse.  Document Released: 10/17/2007 Document Revised: 04/19/2011 Document Reviewed: 09/04/2010 [facility] Patient Information 2012 [place], [place].

## 2011-07-30 NOTE — ED Notes (Signed)
C/o sore throat, runny nose, nasal congestion, sneezing, headache, coughing and eyes watering- sx started on Thursday.  States her chest and back hurts with breathing and coughing.  She also reports diarrhea on Thursday when sx started.

## 2011-07-30 NOTE — ED Provider Notes (Signed)
History     CSN: [id number]  Arrival date & time 07/30/11  0956   None     Chief Complaint  Patient presents with  . URI    (Consider location/radiation/quality/duration/timing/severity/associated sxs/prior treatment) HPI Comments: Patient presents today with complaints of nasal congestion, clear rhinorrhea, sneezing, and sore throat. Symptoms began 4 days ago. She also has a mild nonproductive cough. When she coughs she is now also feeling the pain in her right mid back. No dyspnea or wheezing. She had diarrhea on the first day of symptoms but none since then. No abdominal pain, nausea or vomiting. She has been taking Tylenol Cold with moderate relief of her symptoms.   Past Medical History  Diagnosis Date  . Ovarian cyst     Past Surgical History  Procedure Date  . Colposcopy 2008    has had procedure done couple times  . No past surgeries     Family History  Problem Relation Age of Onset  . Birth defects Sister   . Hypertension Maternal Grandmother   . Stroke Maternal Grandmother   . Diabetes Maternal Grandfather   . Diabetes Paternal Grandmother     History  Substance Use Topics  . Smoking status: Never Smoker   . Smokeless tobacco: Not on file  . Alcohol Use: 0.6 oz/week    1 Glasses of wine per week     occ wine    OB History    Grav Para Term Preterm Abortions TAB SAB Ect Mult Living   3 1 1  0 2 1 1  0 0 1      Review of Systems  Constitutional: Positive for fever, chills and fatigue.  HENT: Positive for congestion, sore throat, rhinorrhea and sneezing. Negative for ear pain and sinus pressure.   Respiratory: Positive for cough. Negative for shortness of breath and wheezing.   Cardiovascular: Negative for chest pain.  Gastrointestinal: Negative for nausea, vomiting and abdominal pain.    Allergies  Rondec  Home Medications   Current Outpatient Rx  Name Route Sig Dispense Refill  . FLUTICASONE PROPIONATE 50 MCG/ACT NA SUSP Nasal Place 2 sprays  into the nose daily. 16 g 0  . THERA M PLUS PO TABS Oral Take 1 tablet by mouth daily.      Marland Kitchen VITAMIN D (ERGOCALCIFEROL) 50000 UNITS PO CAPS Oral Take 50,000 Units by mouth 2 (two) times a week.        BP 108/75  Pulse 80  Temp(Src) 98.5 F (36.9 C) (Oral)  Resp 20  SpO2 98%  LMP 07/23/2011  Physical Exam  Nursing note and vitals reviewed. Constitutional: She appears well-developed and well-nourished. No distress.  HENT:  Head: Normocephalic and atraumatic.  Right Ear: Tympanic membrane, external ear and ear canal normal.  Left Ear: Tympanic membrane, external ear and ear canal normal.  Nose: Mucosal edema present. Right sinus exhibits no maxillary sinus tenderness. Left sinus exhibits no maxillary sinus tenderness.  Mouth/Throat: Uvula is midline, oropharynx is clear and moist and mucous membranes are normal. No oropharyngeal exudate, posterior oropharyngeal edema or posterior oropharyngeal erythema.       Nasal turbinates bilaterally are moderately swollen with clear nasal mucous noted.  Neck: Neck supple.  Cardiovascular: Normal rate, regular rhythm and normal heart sounds.   Pulmonary/Chest: Effort normal and breath sounds normal. No respiratory distress.  Lymphadenopathy:    She has no cervical adenopathy.  Neurological: She is alert.  Skin: Skin is warm and dry.  Psychiatric: She has a normal  mood and affect.    ED Course  Procedures (including critical care time)  Labs Reviewed - No data to display No results found.   1. Acute URI       MDM          Melody Comas, PA 07/30/11 1210

## 2011-07-31 NOTE — ED Provider Notes (Signed)
Medical screening examination/treatment/procedure(s) were performed by non-physician practitioner and as supervising physician I was immediately available for consultation/collaboration.  Alen Bleacher, MD 07/31/11 1600

## 2011-08-01 ENCOUNTER — Encounter (INDEPENDENT_AMBULATORY_CARE_PROVIDER_SITE_OTHER): Payer: Self-pay | Admitting: Obstetrics and Gynecology

## 2011-08-01 DIAGNOSIS — N949 Unspecified condition associated with female genital organs and menstrual cycle: Secondary | ICD-10-CM

## 2011-08-01 DIAGNOSIS — Z209 Contact with and (suspected) exposure to unspecified communicable disease: Secondary | ICD-10-CM

## 2011-08-15 ENCOUNTER — Other Ambulatory Visit: Payer: Self-pay

## 2011-08-15 DIAGNOSIS — R102 Pelvic and perineal pain: Secondary | ICD-10-CM

## 2011-08-24 ENCOUNTER — Emergency Department (HOSPITAL_COMMUNITY)
Admission: EM | Admit: 2011-08-24 | Discharge: 2011-08-24 | Disposition: A | Discharge status: Home / Self Care | Payer: Self-pay | Attending: Emergency Medicine | Admitting: Emergency Medicine

## 2011-08-24 ENCOUNTER — Encounter (HOSPITAL_COMMUNITY): Payer: Self-pay | Admitting: *Deleted

## 2011-08-24 DIAGNOSIS — J329 Chronic sinusitis, unspecified: Secondary | ICD-10-CM | POA: Insufficient documentation

## 2011-08-24 DIAGNOSIS — K029 Dental caries, unspecified: Secondary | ICD-10-CM | POA: Insufficient documentation

## 2011-08-24 MED ORDER — AMOXICILLIN 500 MG PO CAPS: 500.0000 mg | ORAL_CAPSULE | Freq: Three times a day (TID) | ORAL | Status: AC

## 2011-08-24 MED ORDER — CETIRIZINE-PSEUDOEPHEDRINE ER 5-120 MG PO TB12: 1.0000 | ORAL_TABLET | Freq: Every day | ORAL | Status: DC

## 2011-08-24 NOTE — ED Notes (Signed)
Headache for 2 weeks and she also has a toothache.  Worse since 0400am.  No nor v

## 2011-08-24 NOTE — Discharge Instructions (Signed)
I think your pain is coming from a sinusitis and a dental problem. Continue acetaminophen for pain. Take zyrtec D for sinus infection as prescribed. Use nasal saline for congestion, this is sold over the counter. Follow up with your dentist as scheduled. Return if worsening symptoms.   Sinusitis Sinuses are air pockets within the bones of your face. The growth of bacteria within a sinus leads to infection. The infection prevents the sinuses from draining. This infection is called sinusitis. SYMPTOMS  There will be different areas of pain depending on which sinuses have become infected.  The maxillary sinuses often produce pain beneath the eyes.   Frontal sinusitis may cause pain in the middle of the forehead and above the eyes.  Other problems (symptoms) include:  Toothaches.   Colored, pus-like (purulent) drainage from the nose.   Swelling, warmth, and tenderness over the sinus areas may be signs of infection.  TREATMENT  Sinusitis is most often determined by an exam.X-rays may be taken. If x-rays have been taken, make sure you obtain your results or find out how you are to obtain them. Your caregiver may give you medications (antibiotics). These are medications that will help kill the bacteria causing the infection. You may also be given a medication (decongestant) that helps to reduce sinus swelling.  HOME CARE INSTRUCTIONS   Only take over-the-counter or prescription medicines for pain, discomfort, or fever as directed by your caregiver.   Drink extra fluids. Fluids help thin the mucus so your sinuses can drain more easily.   Applying either moist heat or ice packs to the sinus areas may help relieve discomfort.   Use saline nasal sprays to help moisten your sinuses. The sprays can be found at your local drugstore.  SEEK IMMEDIATE MEDICAL CARE IF:  You have a fever.   You have increasing pain, severe headaches, or toothache.   You have nausea, vomiting, or drowsiness.   You  develop unusual swelling around the face or trouble seeing.  MAKE SURE YOU:   Understand these instructions.   Will watch your condition.   Will get help right away if you are not doing well or get worse.  Document Released: 04/30/2005 Document Revised: 04/19/2011 Document Reviewed: 11/27/2006 [facility] Patient Information 2012 [place], [place].

## 2011-08-24 NOTE — ED Notes (Signed)
No answer x1

## 2011-08-24 NOTE — ED Notes (Signed)
Complaining of migraine. States the pain is unbearable. Some photosensitivity

## 2011-08-24 NOTE — ED Notes (Addendum)
Patient came back to ED; patient states that she had to leave to pick up her daughter from daycare and wanted to know if she was still on the waiting list.  Patient informed that she is still in the system; triage RN notified.

## 2011-08-24 NOTE — ED Provider Notes (Signed)
History     CSN: [id number]  Arrival date & time 08/24/11  1747   None     Chief Complaint  Patient presents with  . Dental Pain    (Consider location/radiation/quality/duration/timing/severity/associated sxs/prior treatment) Patient is a 26 y.o. female presenting with tooth pain. The history is provided by the patient.  Dental PainThe primary symptoms include mouth pain, headaches and sore throat. Primary symptoms do not include dental injury, oral lesions, fever, shortness of breath or cough. The symptoms began more than 1 week ago. The symptoms are worsening. The symptoms are new.  Additional symptoms include: ear pain. Additional symptoms do not include: facial swelling.  Pt reports right sided headache on and off for several weeks, worse today. States right ear feels full, pain over right face, and today felt like she had a toothache on the right side. Denies fever, chills, neck pain or stiffness, visual changes, cough, weakness in extremities. Took tylenol yesterday with no relief.   Past Medical History  Diagnosis Date  . Ovarian cyst     Past Surgical History  Procedure Date  . Colposcopy 2008    has had procedure done couple times  . No past surgeries     Family History  Problem Relation Age of Onset  . Birth defects Sister   . Hypertension Maternal Grandmother   . Stroke Maternal Grandmother   . Diabetes Maternal Grandfather   . Diabetes Paternal Grandmother     History  Substance Use Topics  . Smoking status: Never Smoker   . Smokeless tobacco: Not on file  . Alcohol Use: 0.6 oz/week    1 Glasses of wine per week     occ wine    OB History    Grav Para Term Preterm Abortions TAB SAB Ect Mult Living   3 1 1  0 2 1 1  0 0 1      Review of Systems  Constitutional: Negative for fever and chills.  HENT: Positive for ear pain, congestion, sore throat, dental problem and sinus pressure. Negative for facial swelling, neck pain and tinnitus.   Eyes:  Negative.   Respiratory: Negative for cough and shortness of breath.   Gastrointestinal: Negative.   Genitourinary: Negative.   Musculoskeletal: Negative.   Skin: Negative.   Neurological: Positive for headaches.  Psychiatric/Behavioral: Negative.     Allergies  Rondec  Home Medications   Current Outpatient Rx  Name Route Sig Dispense Refill  . THERA M PLUS PO TABS Oral Take 1 tablet by mouth daily.      Marland Kitchen VITAMIN D (ERGOCALCIFEROL) 50000 UNITS PO CAPS Oral Take 50,000 Units by mouth 2 (two) times a week.        BP 124/91  Pulse 86  Temp(Src) 98.2 F (36.8 C) (Oral)  Resp 16  SpO2 99%  LMP 07/23/2011  Physical Exam  Nursing note and vitals reviewed. Constitutional: She is oriented to person, place, and time. She appears well-developed and well-nourished. No distress.  HENT:  Head: Normocephalic and atraumatic.  Right Ear: External ear and ear canal normal. Tympanic membrane is bulging. A middle ear effusion is present.  Left Ear: Tympanic membrane, external ear and ear canal normal.  Nose: Rhinorrhea present. Right sinus exhibits maxillary sinus tenderness. Right sinus exhibits no frontal sinus tenderness. Left sinus exhibits no maxillary sinus tenderness and no frontal sinus tenderness.  Mouth/Throat: Uvula is midline and oropharynx is clear and moist.       Poor dentition, multiple carries  Eyes: Conjunctivae  are normal.  Neck: Normal range of motion. Neck supple.  Cardiovascular: Normal rate, regular rhythm and normal heart sounds.   Pulmonary/Chest: Effort normal and breath sounds normal. No respiratory distress. She has no wheezes.  Musculoskeletal: Normal range of motion.  Lymphadenopathy:    She has no cervical adenopathy.  Neurological: She is alert and oriented to person, place, and time. She displays normal reflexes. No cranial nerve deficit. She exhibits normal muscle tone. Coordination normal.  Skin: Skin is warm and dry. No rash noted.  Psychiatric: She  has a normal mood and affect.    ED Course  Procedures (including critical care time)  Pt with right sided headache, right maxillary sinus tenderness, fluid behind right TM. I suspect this is sinusitis vs dental infection. Will start on amoxicillin to cover for both, zyrtec, sudafed, follow up. Pt otherwise non toxic. Normal neuro exam. VS normal.  Pt states tylenol is enough to treat her pain at home, does not want any pain medications for her headache here. She has an appointment with a dentist in 10 days.  1. Sinusitis   2. Dental caries       MDM          Lottie Mussel, PA 08/25/11 [phone]

## 2011-08-27 NOTE — ED Provider Notes (Signed)
Medical screening examination/treatment/procedure(s) were performed by non-physician practitioner and as supervising physician I was immediately available for consultation/collaboration.   Benny Lennert, MD 08/27/11 [phone]

## 2011-08-29 ENCOUNTER — Other Ambulatory Visit: Payer: Self-pay | Admitting: Obstetrics and Gynecology

## 2011-08-29 NOTE — Telephone Encounter (Signed)
Routed to triage 

## 2011-09-03 NOTE — Telephone Encounter (Signed)
Pt wou

## 2011-09-05 ENCOUNTER — Encounter: Payer: Self-pay | Admitting: Obstetrics and Gynecology

## 2011-09-05 ENCOUNTER — Ambulatory Visit: Payer: Self-pay

## 2011-09-05 DIAGNOSIS — R102 Pelvic and perineal pain: Secondary | ICD-10-CM

## 2011-10-01 DIAGNOSIS — Z87898 Personal history of other specified conditions: Secondary | ICD-10-CM | POA: Insufficient documentation

## 2011-10-01 DIAGNOSIS — B009 Herpesviral infection, unspecified: Secondary | ICD-10-CM | POA: Insufficient documentation

## 2011-10-05 ENCOUNTER — Encounter: Payer: Self-pay | Admitting: Obstetrics and Gynecology

## 2011-10-09 ENCOUNTER — Encounter: Payer: Self-pay | Admitting: Obstetrics and Gynecology

## 2011-11-19 ENCOUNTER — Encounter (HOSPITAL_COMMUNITY): Payer: Self-pay | Admitting: Advanced Practice Midwife

## 2011-11-19 ENCOUNTER — Inpatient Hospital Stay (HOSPITAL_COMMUNITY)
Admission: AD | Admit: 2011-11-19 | Discharge: 2011-11-19 | Disposition: A | Discharge status: Home / Self Care | Payer: Self-pay | Source: Ambulatory Visit | Attending: Obstetrics & Gynecology | Admitting: Obstetrics & Gynecology

## 2011-11-19 DIAGNOSIS — I499 Cardiac arrhythmia, unspecified: Secondary | ICD-10-CM | POA: Insufficient documentation

## 2011-11-19 DIAGNOSIS — R079 Chest pain, unspecified: Secondary | ICD-10-CM

## 2011-11-19 HISTORY — DX: Chlamydial infection, unspecified: A74.9

## 2011-11-19 MED ORDER — SODIUM CHLORIDE 0.9 % IJ SOLN
INTRAMUSCULAR | Status: AC
  Filled 2011-11-19: qty 3

## 2011-11-19 NOTE — MAU Note (Signed)
Dr. Stinson at the bedside. 

## 2011-11-19 NOTE — MAU Note (Signed)
Pt reports "pain in my boob" for last year, pt states she awakened tonight feeling like she can't breathe and the pain is in her chest and up into her jaw and in her left hand and arm

## 2011-11-19 NOTE — MAU Provider Note (Signed)
Elizabeth K Pritchett26 y.G.[id number]. Chief Complaint  Patient presents with  . Chest Pain     First Provider Initiated Contact with Patient 11/19/11 0349      SUBJECTIVE  HPI: HPI: Elizabeth Stevenson is a 26 y.o. year old [id number] female who presents to MAU reporting waking up with chest pain 9/10 radiating down her left arm, SOB and left breast pain. Pain 2/10 soon after arrival. Pt reports gradual onset of similar Sx over the past year. Has been seen in Urgent Care 1/13 w/ essentially normal ECG. Normal CXR. She has not been seen by a PCP or cardiologist due to loss of insurance. Episodes are associated with rapid heartbeat. Pt denies anxiety or exertion as a precipitating factors.   Past Medical History  Diagnosis Date  . Ovarian cyst   . Pelvic pain   . HSV-1 (herpes simplex virus 1) infection   . HSV-2 (herpes simplex virus 2) infection   . Abnormal Pap smear   . Chlamydia    Past Surgical History  Procedure Date  . Colposcopy 2008    has had procedure done couple times   History   Social History  . Marital Status: Single    Spouse Name: N/A    Number of Children: N/A  . Years of Education: N/A   Occupational History  . Not on file.   Social History Main Topics  . Smoking status: Never Smoker   . Smokeless tobacco: Never Used  . Alcohol Use: 0.6 oz/week    1 Glasses of wine per week     occ wine  . Drug Use: No  . Sexually Active: Yes    Birth Control/ Protection: Implant   Other Topics Concern  . Not on file   Social History Narrative  . No narrative on file   No current facility-administered medications on file prior to encounter.   Current Outpatient Prescriptions on File Prior to Encounter  Medication Sig Dispense Refill  . Multiple Vitamins-Minerals (MULTIVITAMINS THER. W/MINERALS) TABS Take 1 tablet by mouth daily.        . cetirizine-pseudoephedrine (ZYRTEC-D) 5-120 MG per tablet Take 1 tablet by mouth daily.  30 tablet  0   Allergies  Allergen  Reactions  . Rondec (Chlorpheniramine-Pseudoeph) Other (See Comments)    Hallucinations.  Not sure which formulation of rondec (brompheniramine vs. Chlorpheniramine)    ROS: Pertinent items in HPI  OBJECTIVE Blood pressure 98/63, pulse 70, temperature 99.1 F (37.3 C), temperature source Oral, resp. rate 20, height 5\' 4"  (1.626 m), weight 99.791 kg (220 lb), SpO2 100.00%. Patient Vitals for the past 24 hrs:  BP Temp Temp src Pulse Resp SpO2 Height Weight  11/19/11 0451 98/63 mmHg - - 70  - - - -  11/19/11 0413 - - - 63  - 100 % - -  11/19/11 0344 114/78 mmHg 99.1 F (37.3 C) Oral 72  20  100 % 5\' 4"  (1.626 m) 99.791 kg (220 lb)     GENERAL: Well-developed, well-nourished female in severe distress upon arrival, breathing rapidly, very anxious. Calmed down quickly with reasurance. Normal color. Not diaphoretic.   HEENT: Normocephalic, good dentition HEART: Regularly irreg rate, normal S1 and S2. No murmur, rub or gallop. RESP: Initially tachypnic, then normal rate and effort ABDOMEN: Soft, non-ender BREAST: Tenderness at 1 o'clock and 7 o"clock of left breast. Nodularity C/W fibrocystic breast tissue. No nipple discharge.  EXTREMITIES: Nontender, no edema, neg Homan's NEURO: Alert and oriented SPECULUM EXAM:deferred  LAB RESULTS  ECG normal sinus rhythm, with sinus arrythmia   IMAGING NA  ED COURSE Per consult w/ Dr. Despina Hidden, transfer to [facility] ED not medically necessary due to spontaneous resolution of Sx, normal O2 Sats and ECG. Dr. Adrian Blackwater at [facility] assessing pt, reading ECG, discussing possible etiologies pain. Suspect SVT. Low suspicion of emergent cardiac condition or PE. Offered pt transfer, declined.  Chest pain absent at time of discharge.  ASSESSMENT  1. Chest pain   2. Irregular heart rate     PLAN D/C home per consult w/ Dr. Despina Hidden. Address breast issues at [facility] clinic appointment in late July. Avoid nipple stimulation and caffeine until then.  Follow-up Information     Follow up with [facility].   Contact information:   [address] [place] [address] [phone]      Follow up with [facility]. (As needed if symptoms worsen)    Contact information:   [address] [place] [address]         Medication List  As of 11/19/2011  4:56 AM   STOP taking these medications         Vitamin D (Ergocalciferol) 50000 UNITS Caps         TAKE these medications         cetirizine-pseudoephedrine 5-120 MG per tablet   Commonly known as: ZYRTEC-D   Take 1 tablet by mouth daily.      multivitamins ther. w/minerals Tabs   Take 1 tablet by mouth daily.      phenazopyridine 95 MG tablet   Commonly known as: PYRIDIUM   Take 95 mg by mouth daily.           [name] 11/19/2011 4:56 AM

## 2011-12-06 ENCOUNTER — Encounter: Payer: Self-pay | Admitting: Obstetrics & Gynecology

## 2011-12-06 ENCOUNTER — Ambulatory Visit (INDEPENDENT_AMBULATORY_CARE_PROVIDER_SITE_OTHER): Payer: Self-pay | Admitting: Obstetrics & Gynecology

## 2011-12-06 ENCOUNTER — Other Ambulatory Visit: Payer: Self-pay | Admitting: Obstetrics & Gynecology

## 2011-12-06 VITALS — BP 109/59 | HR 68 | Temp 98.0°F | Resp 12 | Ht 64.0 in | Wt 215.8 lb

## 2011-12-06 DIAGNOSIS — R102 Pelvic and perineal pain: Secondary | ICD-10-CM | POA: Insufficient documentation

## 2011-12-06 DIAGNOSIS — N949 Unspecified condition associated with female genital organs and menstrual cycle: Secondary | ICD-10-CM

## 2011-12-06 NOTE — Progress Notes (Signed)
Patient ID: Elizabeth Stevenson, female   DOB: 1985-12-22, 26 y.o.   MRN: [id number]  Chief Complaint  Patient presents with  . Ovarian Cyst    HPI Elizabeth Stevenson is a 26 y.o. female.  [id number] Patient is amenorrheic with Implanon in place for over 2-1/2 years. She has pelvic pain which is intermittent and has been ascribed to ovarian cysts. She previously was a patient of Dr. Estanislado Pandy but she has lost her insurance and she can no longer be followed by that practice. She states that she was supposed to have a sonohysterogram. Did not have current record of her care. No bladder symptoms and no abnormal discharge. HPI  Past Medical History  Diagnosis Date  . Ovarian cyst   . Pelvic pain   . HSV-1 (herpes simplex virus 1) infection   . HSV-2 (herpes simplex virus 2) infection   . Abnormal Pap smear   . Chlamydia     Past Surgical History  Procedure Date  . Colposcopy 2008    has had procedure done couple times    Family History  Problem Relation Age of Onset  . Birth defects Sister   . Hypertension Maternal Grandmother   . Stroke Maternal Grandmother   . Diabetes Maternal Grandfather   . Diabetes Paternal Grandmother     Social History History  Substance Use Topics  . Smoking status: Never Smoker   . Smokeless tobacco: Never Used  . Alcohol Use: 0.6 oz/week    1 Glasses of wine per week     occ wine    Allergies  Allergen Reactions  . Rondec (Chlorpheniramine-Pseudoeph) Other (See Comments)    Hallucinations.  Not sure which formulation of rondec (brompheniramine vs. Chlorpheniramine)    Current Outpatient Prescriptions  Medication Sig Dispense Refill  . cetirizine-pseudoephedrine (ZYRTEC-D) 5-120 MG per tablet Take 1 tablet by mouth daily.  30 tablet  0  . Multiple Vitamins-Minerals (MULTIVITAMINS THER. W/MINERALS) TABS Take 1 tablet by mouth daily.        . Phenazopyridine HCl (AZO TABS PO) Take 1 tablet by mouth daily. Takes for yeast infection variety for  frequent yeast infections        Review of Systems Review of Systems  Constitutional: Negative for fever and fatigue.  Cardiovascular: Positive for palpitations (referred for evaluation of SVT).  Genitourinary: Negative for dysuria, urgency, vaginal bleeding, vaginal discharge and difficulty urinating.    Blood pressure 109/59, pulse 68, temperature 98 F (36.7 C), temperature source Oral, resp. rate 12, height 5\' 4"  (1.626 m), weight 215 lb 12.8 oz (97.886 kg).  Physical Exam Physical Exam  Constitutional: She appears well-developed and well-nourished. No distress.  Abdominal: Soft. There is no tenderness.  Genitourinary: Vagina normal and uterus normal. No vaginal discharge (no masses) found.  Neurological: She is alert.  Skin: Skin is warm and dry.  Psychiatric: She has a normal mood and affect.    Data Reviewed MAU visit 11/15/2011  Assessment    Intermittent pelvic pain, amenorrheic with Implanon in place    Plan    Pelvic ultrasound. We will request records from her care at [facility]       Terald Jump 12/06/2011, 2:06 PM

## 2011-12-06 NOTE — Patient Instructions (Signed)
Ovarian Cyst The ovaries are small organs that are on each side of the uterus. The ovaries are the organs that produce the female hormones, estrogen and progesterone. An ovarian cyst is a sac filled with fluid that can vary in its size. It is normal for a small cyst to form in women who are in the childbearing age and who have menstrual periods. This type of cyst is called a follicle cyst that becomes an ovulation cyst (corpus luteum cyst) after it produces the women's egg. It later goes away on its own if the woman does not become pregnant. There are other kinds of ovarian cysts that may cause problems and may need to be treated. The most serious problem is a cyst with cancer. It should be noted that menopausal women who have an ovarian cyst are at a higher risk of it being a cancer cyst. They should be evaluated very quickly, thoroughly and followed closely. This is especially true in menopausal women because of the high rate of ovarian cancer in women in menopause. CAUSES AND TYPES OF OVARIAN CYSTS:  FUNCTIONAL CYST: The follicle/corpus luteum cyst is a functional cyst that occurs every month during ovulation with the menstrual cycle. They go away with the next menstrual cycle if the woman does not get pregnant. Usually, there are no symptoms with a functional cyst.   ENDOMETRIOMA CYST: This cyst develops from the lining of the uterus tissue. This cyst gets in or on the ovary. It grows every month from the bleeding during the menstrual period. It is also called a "chocolate cyst" because it becomes filled with blood that turns brown. This cyst can cause pain in the lower abdomen during intercourse and with your menstrual period.   CYSTADENOMA CYST: This cyst develops from the cells on the outside of the ovary. They usually are not cancerous. They can get very big and cause lower abdomen pain and pain with intercourse. This type of cyst can twist on itself, cut off its blood supply and cause severe pain.  It also can easily rupture and cause a lot of pain.   DERMOID CYST: This type of cyst is sometimes found in both ovaries. They are found to have different kinds of body tissue in the cyst. The tissue includes skin, teeth, hair, and/or cartilage. They usually do not have symptoms unless they get very big. Dermoid cysts are rarely cancerous.   POLYCYSTIC OVARY: This is a rare condition with hormone problems that produces many small cysts on both ovaries. The cysts are follicle-like cysts that never produce an egg and become a corpus luteum. It can cause an increase in body weight, infertility, acne, increase in body and facial hair and lack of menstrual periods or rare menstrual periods. Many women with this problem develop type 2 diabetes. The exact cause of this problem is unknown. A polycystic ovary is rarely cancerous.   THECA LUTEIN CYST: Occurs when too much hormone (human chorionic gonadotropin) is produced and over-stimulates the ovaries to produce an egg. They are frequently seen when doctors stimulate the ovaries for invitro-fertilization (test tube babies).   LUTEOMA CYST: This cyst is seen during pregnancy. Rarely it can cause an obstruction to the birth canal during labor and delivery. They usually go away after delivery.  SYMPTOMS   Pelvic pain or pressure.   Pain during sexual intercourse.   Increasing girth (swelling) of the abdomen.   Abnormal menstrual periods.   Increasing pain with menstrual periods.   You stop having   menstrual periods and you are not pregnant.  DIAGNOSIS  The diagnosis can be made during:  Routine or annual pelvic examination (common).   Ultrasound.   X-ray of the pelvis.   CT Scan.   MRI.   Blood tests.  TREATMENT   Treatment may only be to follow the cyst monthly for 2 to 3 months with your caregiver. Many go away on their own, especially functional cysts.   May be aspirated (drained) with a long needle with ultrasound, or by laparoscopy  (inserting a tube into the pelvis through a small incision).   The whole cyst can be removed by laparoscopy.   Sometimes the cyst may need to be removed through an incision in the lower abdomen.   Hormone treatment is sometimes used to help dissolve certain cysts.   Birth control pills are sometimes used to help dissolve certain cysts.  HOME CARE INSTRUCTIONS  Follow your caregiver's advice regarding:  Medicine.   Follow up visits to evaluate and treat the cyst.   You may need to come back or make an appointment with another caregiver, to find the exact cause of your cyst, if your caregiver is not a gynecologist.   Get your yearly and recommended pelvic examinations and Pap tests.   Let your caregiver know if you have had an ovarian cyst in the past.  SEEK MEDICAL CARE IF:   Your periods are late, irregular, they stop, or are painful.   Your stomach (abdomen) or pelvic pain does not go away.   Your stomach becomes larger or swollen.   You have pressure on your bladder or trouble emptying your bladder completely.   You have painful sexual intercourse.   You have feelings of fullness, pressure, or discomfort in your stomach.   You lose weight for no apparent reason.   You feel generally ill.   You become constipated.   You lose your appetite.   You develop acne.   You have an increase in body and facial hair.   You are gaining weight, without changing your exercise and eating habits.   You think you are pregnant.  SEEK IMMEDIATE MEDICAL CARE IF:   You have increasing abdominal pain.   You feel sick to your stomach (nausea) and/or vomit.   You develop a fever that comes on suddenly.   You develop abdominal pain during a bowel movement.   Your menstrual periods become heavier than usual.  Document Released: 04/30/2005 Document Revised: 04/19/2011 Document Reviewed: 03/03/2009 Exit[employer] Patient Information 2012 [employer]. 

## 2011-12-12 ENCOUNTER — Ambulatory Visit (HOSPITAL_COMMUNITY)
Admission: RE | Admit: 2011-12-12 | Discharge: 2011-12-12 | Disposition: A | Discharge status: Home / Self Care | Payer: Self-pay | Source: Ambulatory Visit | Attending: Obstetrics & Gynecology | Admitting: Obstetrics & Gynecology

## 2011-12-12 DIAGNOSIS — R102 Pelvic and perineal pain: Secondary | ICD-10-CM

## 2011-12-12 DIAGNOSIS — N949 Unspecified condition associated with female genital organs and menstrual cycle: Secondary | ICD-10-CM | POA: Insufficient documentation

## 2011-12-12 DIAGNOSIS — N838 Other noninflammatory disorders of ovary, fallopian tube and broad ligament: Secondary | ICD-10-CM | POA: Insufficient documentation

## 2012-01-01 ENCOUNTER — Telehealth: Payer: Self-pay | Admitting: Medical

## 2012-01-01 NOTE — Telephone Encounter (Signed)
LM for patient indicated that desired results were normal and she could return call to clinic if she had any further questions.

## 2012-01-01 NOTE — Telephone Encounter (Signed)
Patient returned call to clinic to confirm normal results. States that she is still having occasional pelvic pain and wanted to know the cause. I explained that ovarian cysts can resolve on their own and were not visible on the last [place], but could come back periodically and cause some discomfort. I re-iterated that the results of the last US showed no abnormalities. The patient voiced understanding and had no further questions at this time.

## 2012-01-01 NOTE — Telephone Encounter (Signed)
Patient states she had [place] on 12/13/11 and would like results.

## 2012-01-29 ENCOUNTER — Emergency Department (HOSPITAL_COMMUNITY)
Admission: EM | Admit: 2012-01-29 | Discharge: 2012-01-29 | Disposition: A | Discharge status: Home / Self Care | Payer: Self-pay | Attending: Emergency Medicine | Admitting: Emergency Medicine

## 2012-01-29 ENCOUNTER — Encounter (HOSPITAL_COMMUNITY): Payer: Self-pay | Admitting: Emergency Medicine

## 2012-01-29 DIAGNOSIS — H9209 Otalgia, unspecified ear: Secondary | ICD-10-CM | POA: Insufficient documentation

## 2012-01-29 DIAGNOSIS — R059 Cough, unspecified: Secondary | ICD-10-CM | POA: Insufficient documentation

## 2012-01-29 DIAGNOSIS — J3489 Other specified disorders of nose and nasal sinuses: Secondary | ICD-10-CM | POA: Insufficient documentation

## 2012-01-29 DIAGNOSIS — R05 Cough: Secondary | ICD-10-CM | POA: Insufficient documentation

## 2012-01-29 DIAGNOSIS — J111 Influenza due to unidentified influenza virus with other respiratory manifestations: Secondary | ICD-10-CM

## 2012-01-29 MED ORDER — ACETAMINOPHEN 325 MG PO TABS
650.0000 mg | ORAL_TABLET | Freq: Once | ORAL | Status: AC
  Administered 2012-01-29: 650 mg via ORAL
  Filled 2012-01-29: qty 2

## 2012-01-29 NOTE — ED Provider Notes (Signed)
History  This chart was scribed for Joya Gaskins, MD by Ladona Ridgel Day. This patient was seen in room TR11C/TR11C and the patient's care was started at 1033.   CSN: [id number]  Arrival date & time 01/29/12  1033   First MD Initiated Contact with Patient 01/29/12 1201      Chief Complaint  Patient presents with  . URI  . Nasal Congestion   Patient is a 26 y.o. female presenting with URI. The history is provided by the patient. No language interpreter was used.  URI The primary symptoms include fever, ear pain, sore throat, cough and myalgias. Primary symptoms do not include abdominal pain, nausea or vomiting. The current episode started 3 to 5 days ago. This is a new problem. The problem has been gradually worsening.  The onset of the illness is associated with exposure to sick contacts. Symptoms associated with the illness include chills and congestion.   YASMEN CORTNER is a 26 y.o. female who presents to the Emergency Department complaining of constant gradually worsening sinus congestion/cough for the past 5 days. She states some chest tightness with coughing, ear pain, fever, sore throat, chills, muscles aches. Sick contacts at home. Has been taking Advil cold and sinus with Claritin and no relief, she has not taken any medicines today. She has no medical history She denies pregnancy  Past Medical History  Diagnosis Date  . Ovarian cyst   . Pelvic pain   . HSV-1 (herpes simplex virus 1) infection   . HSV-2 (herpes simplex virus 2) infection   . Abnormal Pap smear   . Chlamydia     Past Surgical History  Procedure Date  . Colposcopy 2008    has had procedure done couple times    Family History  Problem Relation Age of Onset  . Birth defects Sister   . Hypertension Maternal Grandmother   . Stroke Maternal Grandmother   . Diabetes Maternal Grandfather   . Diabetes Paternal Grandmother     History  Substance Use Topics  . Smoking status: Never Smoker   .  Smokeless tobacco: Never Used  . Alcohol Use: 0.6 oz/week    1 Glasses of wine per week     occ wine    OB History    Grav Para Term Preterm Abortions TAB SAB Ect Mult Living   3 1 1  0 2 1 1  0 0 1      Review of Systems  Constitutional: Positive for fever and chills.  HENT: Positive for ear pain, congestion and sore throat.   Respiratory: Positive for cough.   Gastrointestinal: Negative for nausea, vomiting and abdominal pain.  Musculoskeletal: Positive for myalgias.    Allergies  Rondec  Home Medications   Current Outpatient Rx  Name Route Sig Dispense Refill  . IBUPROFEN 200 MG PO TABS Oral Take 200 mg by mouth every 4 (four) hours as needed. For pain    . LORATADINE 10 MG PO TABS Oral Take 10 mg by mouth daily as needed. For allergies    . THERA M PLUS PO TABS Oral Take 1 tablet by mouth daily.      . AZO TABS PO Oral Take 1 tablet by mouth daily. Takes for yeast infection variety for frequent yeast infections      Triage Vitals: BP 94/64  Pulse 107  Temp 100.1 F (37.8 C) (Oral)  Resp 18  SpO2 98% BP 117/79  Pulse 95  Temp 100.1 F (37.8 C) (Oral)  Resp  20  SpO2 100%   Physical Exam CONSTITUTIONAL: Well developed/well nourished HEAD AND FACE: Normocephalic/atraumatic EYES: EOMI/PERRL ENMT: Mucous membranes moist, uvula midline, pharynx normal, left tm/right tm normal NECK: supple no meningeal signs SPINE:entire spine nontender CV: S1/S2 noted, no murmurs/rubs/gallops noted LUNGS: Lungs are clear to auscultation bilaterally, no apparent distress ABDOMEN: soft, nontender, no rebound or guarding GU:no cva tenderness NEURO: Pt is awake/alert, moves all extremitiesx4 EXTREMITIES: pulses normal, full ROM SKIN: warm, color normal PSYCH: no abnormalities of mood noted ED Course  Procedures  DIAGNOSTIC STUDIES: Oxygen Saturation is 98% on room air, normal by my interpretation.    COORDINATION OF CARE: At 1210 PM Discussed treatment plan with patient  which includes tylenolPatient agrees.  Suspect influenza, but pt is well appearing, no distress.   Discussed strict return precautions  MDM  Nursing notes including past medical history and social history reviewed and considered in documentation   I personally performed the services described in this documentation, which was scribed in my presence. The recorded information has been reviewed and considered.          Joya Gaskins, MD 01/29/12 [phone]

## 2012-01-29 NOTE — ED Notes (Signed)
Pt c/o URI sx with head congestion and cough and sneezing x 5 days; pt sts pain in chest with cough

## 2012-03-07 ENCOUNTER — Telehealth: Payer: Self-pay | Admitting: Obstetrics and Gynecology

## 2012-03-07 NOTE — Telephone Encounter (Signed)
Spoke with pt rgd msg pt had question if we can draw certain labs for her insurance company to keep insurance advised pt can go to pcp or urgent care they can do those testing for her pt voice understanding

## 2012-03-26 ENCOUNTER — Ambulatory Visit (INDEPENDENT_AMBULATORY_CARE_PROVIDER_SITE_OTHER): Payer: 59 | Admitting: Obstetrics and Gynecology

## 2012-03-26 ENCOUNTER — Encounter: Payer: Self-pay | Admitting: Obstetrics and Gynecology

## 2012-03-26 VITALS — BP 118/80 | Ht 64.5 in | Wt 225.0 lb

## 2012-03-26 DIAGNOSIS — Z01419 Encounter for gynecological examination (general) (routine) without abnormal findings: Secondary | ICD-10-CM

## 2012-03-26 DIAGNOSIS — R102 Pelvic and perineal pain: Secondary | ICD-10-CM

## 2012-03-26 DIAGNOSIS — N949 Unspecified condition associated with female genital organs and menstrual cycle: Secondary | ICD-10-CM

## 2012-03-26 DIAGNOSIS — Z124 Encounter for screening for malignant neoplasm of cervix: Secondary | ICD-10-CM

## 2012-03-26 LAB — HIV ANTIBODY (ROUTINE TESTING W REFLEX): HIV: NONREACTIVE

## 2012-03-26 LAB — RPR

## 2012-03-26 NOTE — Progress Notes (Signed)
The patient reports 1) talk about BC  2) Abdominal pain Pt c/o a hurt cramp feeling  3) Sleep issues 4) Headache left sided x over a month 5) Left eye twitches 6) Breast  Pain   Contraception:Implant  Last mammogram: none  Last pap: 01/31/2012  GC/Chlamydia cultures offered: requested HIV/RPR/HbsAg offered:  requested HSV 1 and 2 glycoprotein offered: requested  Menstrual cycle regular and monthly: No: Implanon x November 2010 Menstrual flow normal: No:   Urinary symptoms: none Normal bowel movements: Yes Reports abuse at home: No:   Subjective:    Elizabeth Stevenson is a 26 y.o. female, [id number], who presents for an annual exam.     History   Social History  . Marital Status: Single    Spouse Name: N/A    Number of Children: N/A  . Years of Education: N/A   Social History Main Topics  . Smoking status: Never Smoker   . Smokeless tobacco: Never Used  . Alcohol Use: 0.6 oz/week    1 Glasses of wine per week     Comment: occ wine  . Drug Use: No  . Sexually Active: Yes    Birth Control/ Protection: Implant   Other Topics Concern  . None   Social History Narrative  . None    Menstrual cycle:   LMP: Patient's last menstrual period was 07/03/2011.           Cycle: No cycle since Feb. Before then cycles were 3-6 months apart. Last 6-7 days. Normal bleeding wearing pap at least 3 hours. Pt c/o sharp suprapubic pain intermittent. Pain scall= 3/10. No radiating with pain.   The following portions of the patient's history were reviewed and updated as appropriate: allergies, current medications, past family history, past medical history, past social history, past surgical history and problem list.  Review of Systems Pertinent items are noted in HPI. Breast:Negative for breast lump,nipple discharge or nipple retraction Gastrointestinal: Negative for abdominal pain, change in bowel habits or rectal bleeding Urinary:negative   Objective:    BP 118/80  Ht 5' 4.5" (1.638 m)   Wt 225 lb (102.059 kg)  BMI 38.02 kg/m2  LMP 07/03/2011    Weight:  Wt Readings from Last 1 Encounters:  03/26/12 225 lb (102.059 kg)          BMI: Body mass index is 38.02 kg/(m^2).  General Appearance: Alert, appropriate appearance for age. No acute distress HEENT: Grossly normal Neck / Thyroid: Supple, no masses, nodes or enlargement Lungs: clear to auscultation bilaterally Back: No CVA tenderness Breast Exam: No masses or nodes.No dimpling, nipple retraction or discharge. Cardiovascular: Regular rate and rhythm. S1, S2, no murmur Gastrointestinal: Soft, non-tender, no masses or organomegaly Pelvic Exam: Vulva and vagina appear normal. Bimanual exam reveals normal uterus and adnexa. Rectovaginal: not indicated Lymphatic Exam: Non-palpable nodes in neck, clavicular, axillary, or inguinal regions Skin: no rash or abnormalities Neurologic: Normal gait and speech, no tremor  Psychiatric: Alert and oriented, appropriate affect.    Assessment:    Normal gyn exam Contraceptive management  Unexplained pelvic pain Multiple somatic complaints   Plan:    pap smear return annually or prn STD screening: done Contraception: Nexplanon Stool softener reccommended  PCP reccommended Dr. Dorothyann Peng Discussed breast tenderness.  Nexplanon was reviewed with the patient  With expected benefits of: 3 year duration, high reliability at 99.9%, lack of estrogen and ease of insertion. Risks of DUB and possible difficult removal discussed      Dois Davenport  Riti Rollyson MD

## 2012-03-28 LAB — PAP IG, CT-NG, RFX HPV ASCU: Chlamydia Probe Amp: NEGATIVE

## 2012-04-07 ENCOUNTER — Telehealth: Payer: Self-pay | Admitting: Obstetrics and Gynecology

## 2012-04-07 NOTE — Telephone Encounter (Signed)
SR pt 

## 2012-04-07 NOTE — Telephone Encounter (Signed)
TC to pt regarding msg. Pt states that she is having vaginal discharge and irritation. States that she is "100%" sure she has a yeast infection. Offered pt apt on Wed with EP. Pt states that she is unable to miss work for apt. Wanting to see if something can just be called into pharm for her since she was just here on 03/26/12. Would you like something called in for her or does she need apt?  Darien Ramus

## 2012-04-08 NOTE — Telephone Encounter (Signed)
VM from pt requesting med for yeast infection Advised pt that I have not received response from SR. Would give her a call as soon as I knew if she would be able to get med without apt.  Pt voiced  Understanding. Elizabeth Stevenson

## 2012-04-09 MED ORDER — TERCONAZOLE 80 MG VA SUPP: 80.0000 mg | Freq: Every day | VAGINAL | Status: DC

## 2012-04-09 NOTE — Telephone Encounter (Signed)
Ok to call in [place] suppository hs x3

## 2012-04-09 NOTE — Telephone Encounter (Signed)
LVM to advise pt Elizabeth Stevenson

## 2012-04-15 ENCOUNTER — Other Ambulatory Visit (INDEPENDENT_AMBULATORY_CARE_PROVIDER_SITE_OTHER): Payer: 59

## 2012-04-15 ENCOUNTER — Ambulatory Visit (INDEPENDENT_AMBULATORY_CARE_PROVIDER_SITE_OTHER)
Admission: RE | Admit: 2012-04-15 | Discharge: 2012-04-15 | Disposition: A | Discharge status: Home / Self Care | Payer: 59 | Source: Ambulatory Visit | Attending: Internal Medicine | Admitting: Internal Medicine

## 2012-04-15 ENCOUNTER — Encounter: Payer: Self-pay | Admitting: Internal Medicine

## 2012-04-15 ENCOUNTER — Ambulatory Visit (INDEPENDENT_AMBULATORY_CARE_PROVIDER_SITE_OTHER): Payer: 59 | Admitting: Internal Medicine

## 2012-04-15 VITALS — BP 114/70 | HR 67 | Temp 98.1°F | Ht 61.5 in | Wt 225.0 lb

## 2012-04-15 DIAGNOSIS — Z1329 Encounter for screening for other suspected endocrine disorder: Secondary | ICD-10-CM

## 2012-04-15 DIAGNOSIS — Z13220 Encounter for screening for lipoid disorders: Secondary | ICD-10-CM

## 2012-04-15 DIAGNOSIS — Z131 Encounter for screening for diabetes mellitus: Secondary | ICD-10-CM

## 2012-04-15 DIAGNOSIS — Z0000 Encounter for general adult medical examination without abnormal findings: Secondary | ICD-10-CM

## 2012-04-15 DIAGNOSIS — R000 Tachycardia, unspecified: Secondary | ICD-10-CM

## 2012-04-15 DIAGNOSIS — Z130 Encounter for screening for diseases of the blood and blood-forming organs and certain disorders involving the immune mechanism: Secondary | ICD-10-CM

## 2012-04-15 DIAGNOSIS — M542 Cervicalgia: Secondary | ICD-10-CM

## 2012-04-15 DIAGNOSIS — Z13228 Encounter for screening for other metabolic disorders: Secondary | ICD-10-CM

## 2012-04-15 DIAGNOSIS — Z1321 Encounter for screening for nutritional disorder: Secondary | ICD-10-CM

## 2012-04-15 LAB — CBC
HCT: 37.2 % (ref 36.0–46.0)
Hemoglobin: 12.4 g/dL (ref 12.0–15.0)
RDW: 13 % (ref 11.5–14.6)
WBC: 4.9 10*3/uL (ref 4.5–10.5)

## 2012-04-15 LAB — TSH: TSH: 0.98 u[IU]/mL (ref 0.35–5.50)

## 2012-04-15 LAB — BASIC METABOLIC PANEL
CO2: 27 mEq/L (ref 19–32)
Calcium: 8.9 mg/dL (ref 8.4–10.5)
Creatinine, Ser: 0.9 mg/dL (ref 0.4–1.2)

## 2012-04-15 LAB — LIPID PANEL
Total CHOL/HDL Ratio: 2
Triglycerides: 46 mg/dL (ref 0.0–149.0)

## 2012-04-15 NOTE — Patient Instructions (Signed)
Health Maintenance, Females A healthy lifestyle and preventative care can promote health and wellness.  Maintain regular health, dental, and eye exams.  Eat a healthy diet. Foods like vegetables, fruits, whole grains, low-fat dairy products, and lean protein foods contain the nutrients you need without too many calories. Decrease your intake of foods high in solid fats, added sugars, and salt. Get information about a proper diet from your caregiver, if necessary.  Regular physical exercise is one of the most important things you can do for your health. Most adults should get at least 150 minutes of moderate-intensity exercise (any activity that increases your heart rate and causes you to sweat) each week. In addition, most adults need muscle-strengthening exercises on 2 or more days a week.   Maintain a healthy weight. The body mass index (BMI) is a screening tool to identify possible weight problems. It provides an estimate of body fat based on height and weight. Your caregiver can help determine your BMI, and can help you achieve or maintain a healthy weight. For adults 20 years and older:  A BMI below 18.5 is considered underweight.  A BMI of 18.5 to 24.9 is normal.  A BMI of 25 to 29.9 is considered overweight.  A BMI of 30 and above is considered obese.  Maintain normal blood lipids and cholesterol by exercising and minimizing your intake of saturated fat. Eat a balanced diet with plenty of fruits and vegetables. Blood tests for lipids and cholesterol should begin at age 20 and be repeated every 5 years. If your lipid or cholesterol levels are high, you are over 50, or you are a high risk for heart disease, you may need your cholesterol levels checked more frequently.Ongoing high lipid and cholesterol levels should be treated with medicines if diet and exercise are not effective.  If you smoke, find out from your caregiver how to quit. If you do not use tobacco, do not start.  If you  are pregnant, do not drink alcohol. If you are breastfeeding, be very cautious about drinking alcohol. If you are not pregnant and choose to drink alcohol, do not exceed 1 drink per day. One drink is considered to be 12 ounces (355 mL) of beer, 5 ounces (148 mL) of wine, or 1.5 ounces (44 mL) of liquor.  Avoid use of street drugs. Do not share needles with anyone. Ask for help if you need support or instructions about stopping the use of drugs.  High blood pressure causes heart disease and increases the risk of stroke. Blood pressure should be checked at least every 1 to 2 years. Ongoing high blood pressure should be treated with medicines, if weight loss and exercise are not effective.  If you are 55 to 24 years old, ask your caregiver if you should take aspirin to prevent strokes.  Diabetes screening involves taking a blood sample to check your fasting blood sugar level. This should be done once every 3 years, after age 45, if you are within normal weight and without risk factors for diabetes. Testing should be considered at a younger age or be carried out more frequently if you are overweight and have at least 1 risk factor for diabetes.  Breast cancer screening is essential preventative care for women. You should practice "breast self-awareness." This means understanding the normal appearance and feel of your breasts and may include breast self-examination. Any changes detected, no matter how small, should be reported to a caregiver. Women in their 20s and 30s should have   a clinical breast exam (CBE) by a caregiver as part of a regular health exam every 1 to 3 years. After age 40, women should have a CBE every year. Starting at age 40, women should consider having a mammogram (breast X-ray) every year. Women who have a family history of breast cancer should talk to their caregiver about genetic screening. Women at a high risk of breast cancer should talk to their caregiver about having an MRI and a  mammogram every year.  The Pap test is a screening test for cervical cancer. Women should have a Pap test starting at age 21. Between ages 21 and 29, Pap tests should be repeated every 2 years. Beginning at age 30, you should have a Pap test every 3 years as long as the past 3 Pap tests have been normal. If you had a hysterectomy for a problem that was not cancer or a condition that could lead to cancer, then you no longer need Pap tests. If you are between ages 65 and 70, and you have had normal Pap tests going back 10 years, you no longer need Pap tests. If you have had past treatment for cervical cancer or a condition that could lead to cancer, you need Pap tests and screening for cancer for at least 20 years after your treatment. If Pap tests have been discontinued, risk factors (such as a new sexual partner) need to be reassessed to determine if screening should be resumed. Some women have medical problems that increase the chance of getting cervical cancer. In these cases, your caregiver may recommend more frequent screening and Pap tests.  The human papillomavirus (HPV) test is an additional test that may be used for cervical cancer screening. The HPV test looks for the virus that can cause the cell changes on the cervix. The cells collected during the Pap test can be tested for HPV. The HPV test could be used to screen women aged 30 years and older, and should be used in women of any age who have unclear Pap test results. After the age of 30, women should have HPV testing at the same frequency as a Pap test.  Colorectal cancer can be detected and often prevented. Most routine colorectal cancer screening begins at the age of 50 and continues through age 75. However, your caregiver may recommend screening at an earlier age if you have risk factors for colon cancer. On a yearly basis, your caregiver may provide home test kits to check for hidden blood in the stool. Use of a small camera at the end of a  tube, to directly examine the colon (sigmoidoscopy or colonoscopy), can detect the earliest forms of colorectal cancer. Talk to your caregiver about this at age 50, when routine screening begins. Direct examination of the colon should be repeated every 5 to 10 years through age 75, unless early forms of pre-cancerous polyps or small growths are found.  Hepatitis C blood testing is recommended for all people born from 1945 through 1965 and any individual with known risks for hepatitis C.  Practice safe sex. Use condoms and avoid high-risk sexual practices to reduce the spread of sexually transmitted infections (STIs). Sexually active women aged 25 and younger should be checked for Chlamydia, which is a common sexually transmitted infection. Older women with new or multiple partners should also be tested for Chlamydia. Testing for other STIs is recommended if you are sexually active and at increased risk.  Osteoporosis is a disease in which the   bones lose minerals and strength with aging. This can result in serious bone fractures. The risk of osteoporosis can be identified using a bone density scan. Women ages 65 and over and women at risk for fractures or osteoporosis should discuss screening with their caregivers. Ask your caregiver whether you should be taking a calcium supplement or vitamin D to reduce the rate of osteoporosis.  Menopause can be associated with physical symptoms and risks. Hormone replacement therapy is available to decrease symptoms and risks. You should talk to your caregiver about whether hormone replacement therapy is right for you.  Use sunscreen with a sun protection factor (SPF) of 30 or greater. Apply sunscreen liberally and repeatedly throughout the day. You should seek shade when your shadow is shorter than you. Protect yourself by wearing long sleeves, pants, a wide-brimmed hat, and sunglasses year round, whenever you are outdoors.  Notify your caregiver of new moles or  changes in moles, especially if there is a change in shape or color. Also notify your caregiver if a mole is larger than the size of a pencil eraser.  Stay current with your immunizations. Document Released: 11/13/2010 Document Revised: 07/23/2011 Document Reviewed: 11/13/2010 Exit[employer] Patient Information 2013 [employer]. Breast Self-Exam A self breast exam may help you find changes or problems while they are still small. Do a breast self-exam:  Every month.  One week after your period (menstrual period).  On the first day of each month if you do not have periods anymore. Look for any:  Change in breast color, size, or shape.  Dimples in your breast.  Changes in your nipples or skin.  Dry skin on your breasts or nipples.  Watery or bloody discharge from your nipples.  Feel for:  Lumps.  Thick, hard places.  Any other changes. HOME CARE There are 3 ways to do the breast self-exam: In front of a mirror.  Lift your arms over your head and turn side to side.  Put your hands on your hips and lean down, then turn from side to side.  Bend forward and turn from side to side. In the shower.  With soapy hands, check both breasts. Then check above and below your collarbone and your armpits.  Feel above and below your collarbone down to under your breast, and from the center of your chest to the outer edge of the armpit. Check for any lumps or hard spots.  Using the tips of your middle three fingers check your whole breast by pressing your hand over your breast in a circle or in an up and down motion. Lying down.  Lie flat on your bed.  Put a small pillow under the breast you are going to check. On that same side, put your hand behind your head.  With your other hand, use the 3 middle fingers to feel the breast.  Move your fingers in a circle around the breast. Press firmly over all parts of the breast to feel for any lumps. GET HELP RIGHT AWAY IF: You find any  changes in your breasts so they can be checked. Document Released: 10/17/2007 Document Revised: 07/23/2011 Document Reviewed: 08/18/2008 Exit[employer] Patient Information 2013 [employer].  

## 2012-04-15 NOTE — Progress Notes (Signed)
HPI  Pt presents to the clinic today to establish care. She has a few concerns. 1- She has been having an increase in neck pain and headaches. She has had this for about 2  Months but it is slowly getting worse. She used to see a chiropractor who would adjust her neck into alignment. She hasn't seen him in a while. The pain is intermittent 4/10 relieved with ibuprofen. She also c/o intermittent tachycardia that occurs when she is laying down at night. She denies chest pain or shortness of breath. It sometimes wakes her up at night and makes her anxious. She has never had this evaluated.  Past Medical History  Diagnosis Date  . Ovarian cyst   . Pelvic pain   . HSV-1 (herpes simplex virus 1) infection   . HSV-2 (herpes simplex virus 2) infection   . Abnormal Pap smear   . Chlamydia   . Ventricular tachycardia   . Migraine   . Diabetes mellitus without complication   . GERD (gastroesophageal reflux disease)     Current Outpatient Prescriptions  Medication Sig Dispense Refill  . acetaminophen (TYLENOL) 325 MG tablet Take 650 mg by mouth as needed.      . Biotin 1 MG CAPS Take by mouth.      . Cholecalciferol (VITAMIN D) 2000 UNITS CAPS Take 1 capsule by mouth daily.      Marland Kitchen etonogestrel (IMPLANON) 68 MG IMPL implant Inject 1 each into the skin once.      Marland Kitchen ibuprofen (ADVIL,MOTRIN) 200 MG tablet Take 200 mg by mouth every 4 (four) hours as needed. For pain      . Multiple Vitamin (MULTIVITAMIN) tablet Take 1 tablet by mouth daily.      . Multiple Vitamins-Minerals (MULTIVITAMINS THER. W/MINERALS) TABS Take 1 tablet by mouth daily.        . Phenazopyridine HCl (AZO TABS PO) Take 1 tablet by mouth daily. Takes for yeast infection variety for frequent yeast infections        Allergies  Allergen Reactions  . Rondec (Chlorpheniramine-Pseudoeph) Other (See Comments)    Hallucinations.  Not sure which formulation of rondec (brompheniramine vs. Chlorpheniramine)    Family History  Problem  Relation Age of Onset  . Birth defects Sister   . Hypertension Maternal Grandmother   . Stroke Maternal Grandmother   . Diabetes Maternal Grandfather   . Diabetes Paternal Grandmother   . Hypertension Mother     History   Social History  . Marital Status: Single    Spouse Name: N/A    Number of Children: 1  . 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 Smoking status: Former Games developer  . Smokeless tobacco: Never Used  . Alcohol Use: 0.6 oz/week    1 Glasses of wine per week     Comment: occ wine  . Drug Use: No  . Sexually Active: Yes    Birth Control/ Protection: Implant   Other Topics Concern  . Not on file   Social History Narrative   Caffeine Use-yesRegular exercise-no    ROS:  Constitutional: Denies fever, malaise, fatigue, headache or abrupt weight changes.  HEENT: Denies eye pain, eye redness, ear pain, ringing in the ears, wax buildup, runny nose, nasal congestion, bloody nose, or sore throat. Respiratory: Denies difficulty breathing, shortness of breath, cough or sputum production.   Cardiovascular: Denies chest pain, chest tightness, palpitations or swelling in the hands  or feet.  Gastrointestinal: Denies abdominal pain, bloating, constipation, diarrhea or blood in the stool.  GU: Denies frequency, urgency, pain with urination, blood in urine, odor or discharge. Musculoskeletal: Denies decrease in range of motion, difficulty with gait, muscle pain or joint pain and swelling.  Skin: Denies redness, rashes, lesions or ulcercations.  Neurological: Denies dizziness, difficulty with memory, difficulty with speech or problems with balance and coordination.   No other specific complaints in a complete review of systems (except as listed in HPI above).  PE:  BP 114/70  Pulse 67  Temp 98.1 F (36.7 C) (Oral)  Ht 5' 1.5" (1.562 m)  Wt 225 lb (102.059 kg)  BMI 41.82 kg/m2  SpO2 98%  LMP 07/03/2011 Wt Readings  from Last 3 Encounters:  04/15/12 225 lb (102.059 kg)  03/26/12 225 lb (102.059 kg)  12/06/11 215 lb 12.8 oz (97.886 kg)    General: Appears her stated age, obese but well developed, well nourished in NAD. HEENT: Head: normal shape and size; Eyes: sclera white, no icterus, conjunctiva pink, PERRLA and EOMs intact; Ears: Tm's gray and intact, normal light reflex; Nose: mucosa pink and moist, septum midline; Throat/Mouth: Teeth present, mucosa pink and moist, no lesions or ulcerations noted.  Neck: Normal range of motion. Neck supple, trachea midline. No massses, lumps or thyromegaly present.  Cardiovascular: Normal rate and rhythm. S1,S2 noted.  No murmur, rubs or gallops noted. No JVD or BLE edema. No carotid bruits noted. Pulmonary/Chest: Normal effort and positive vesicular breath sounds. No respiratory distress. No wheezes, rales or ronchi noted.  Abdomen: Soft and nontender. Normal bowel sounds, no bruits noted. No distention or masses noted. Liver, spleen and kidneys non palpable. Musculoskeletal: Normal range of motion. No signs of joint swelling. No difficulty with gait.  Neurological: Alert and oriented. Cranial nerves II-XII intact. Coordination normal. +DTRs bilaterally. Psychiatric: Mood and affect normal. Behavior is normal. Judgment and thought content normal.     BMET    Component Value Date/Time   NA 137 12/24/2007 1145   K 4.3 HEMOLYZED SPECIMEN, RESULTS MAY BE AFFECTED 12/24/2007 1145   CL 108 12/24/2007 1145   CO2 24 12/24/2007 1145   GLUCOSE 93 12/24/2007 1145   BUN 5* 12/24/2007 1145   CREATININE 0.84 12/24/2007 1145   CALCIUM 8.8 12/24/2007 1145   GFRNONAA >60 12/24/2007 1145   GFRAA  Value: >60        The eGFR has been calculated using the MDRD equation. This calculation has not been validated in all clinical 12/24/2007 1145    Lipid Panel  No results found for this basename: chol, trig, hdl, cholhdl, vldl, ldlcalc    CBC    Component Value Date/Time   WBC 7.0  07/25/2010 2324   RBC 4.05 07/25/2010 2324   HGB 12.5 07/25/2010 2324   HCT 38.2 07/25/2010 2324   PLT 263 07/25/2010 2324   MCV 94.3 07/25/2010 2324   MCH 30.9 07/25/2010 2324   MCHC 32.7 07/25/2010 2324   RDW 12.9 07/25/2010 2324   LYMPHSABS 1.7 12/24/2007 1145   MONOABS 0.4 12/24/2007 1145   EOSABS 0.1 12/24/2007 1145   BASOSABS 0.0 12/24/2007 1145    Hgb A1C No results found for this basename: HGBA1C     Assessment and Plan:  Preventative Health Maintenance:  Start diet and exercise program Pt declines flu shot and tdap today Patient had pap smear in 11/13 Will obtain labs today BMET, CBC, TSH, lipids and Vit D  Neck pain and headaches,  new onset with additional workup required:  Will obtain xray of cervical spine Continue taking ibuprofen for pain  Tachycardia, new onset with additional workup required:  Will refer to cardiology for further evaluation  RTC in 1 year or sooner if needed.

## 2012-04-16 ENCOUNTER — Other Ambulatory Visit: Payer: Self-pay | Admitting: Internal Medicine

## 2012-04-16 DIAGNOSIS — E559 Vitamin D deficiency, unspecified: Secondary | ICD-10-CM | POA: Insufficient documentation

## 2012-04-16 LAB — VITAMIN D 25 HYDROXY (VIT D DEFICIENCY, FRACTURES): Vit D, 25-Hydroxy: 28 ng/mL — ABNORMAL LOW (ref 30–89)

## 2012-04-16 MED ORDER — ERGOCALCIFEROL 1.25 MG (50000 UT) PO CAPS: 50000.0000 [IU] | ORAL_CAPSULE | ORAL | Status: DC

## 2012-04-16 NOTE — Progress Notes (Signed)
Vit D level low at 28. Will order ergocalciferol 50000 units weekly x 12 weeks then recheck level.

## 2012-04-23 ENCOUNTER — Encounter: Payer: 59 | Admitting: Obstetrics and Gynecology

## 2012-04-23 ENCOUNTER — Other Ambulatory Visit: Payer: 59

## 2012-04-29 ENCOUNTER — Encounter: Payer: Self-pay | Admitting: Cardiovascular Disease

## 2012-04-29 ENCOUNTER — Ambulatory Visit (INDEPENDENT_AMBULATORY_CARE_PROVIDER_SITE_OTHER): Payer: 59 | Admitting: Cardiovascular Disease

## 2012-04-29 ENCOUNTER — Ambulatory Visit: Payer: 59 | Admitting: Cardiovascular Disease

## 2012-04-29 VITALS — BP 122/70 | HR 84 | Ht 64.0 in | Wt 228.0 lb

## 2012-04-29 DIAGNOSIS — R079 Chest pain, unspecified: Secondary | ICD-10-CM

## 2012-04-29 DIAGNOSIS — R002 Palpitations: Secondary | ICD-10-CM | POA: Insufficient documentation

## 2012-04-29 NOTE — Progress Notes (Signed)
Patient ID: Elizabeth Stevenson, female   DOB: 1986-03-11, 26 y.o.   MRN: [id number] 26 yo referred by primary for palpitations  Chronic problem  Has been in ER and [facility] this year with no documented arrhythmia.  No documented SVT  Symptoms intermitant  Palpitations more rapid than irregular  Last minutes.  Occur frequently at night when in bed.  Sedentary but not related to exercise. No history of syncope.  No family history of heart issues or syncope.  Denies stiimulants.  Recent TSH normal. Has crampy muscular pain and atypical pain in left side of chest.  Mild exertional dyspnea.    ROS: Denies fever, malais, weight loss, blurry vision, decreased visual acuity, cough, sputum, SOB, hemoptysis, pleuritic pain, palpitaitons, heartburn, abdominal pain, melena, lower extremity edema, claudication, or rash.  All other systems reviewed and negative   General: Affect appropriate Healthy:  appears stated age HEENT: normal Neck supple with no adenopathy JVP normal no bruits no thyromegaly Lungs clear with no wheezing and good diaphragmatic motion Heart:  S1/S2 no murmur,rub, gallop or click PMI normal Abdomen: benighn, BS positve, no tenderness, no AAA no bruit.  No HSM or HJR Distal pulses intact with no bruits No edema Neuro non-focal Skin warm and dry No muscular weakness  Medications Current Outpatient Prescriptions  Medication Sig Dispense Refill  . acetaminophen (TYLENOL) 325 MG tablet Take 650 mg by mouth as needed.      . ergocalciferol (VITAMIN D2) 50000 UNITS capsule Take 1 capsule (50,000 Units total) by mouth once a week.  12 capsule  0  . etonogestrel (IMPLANON) 68 MG IMPL implant Inject 1 each into the skin once.      Marland Kitchen ibuprofen (ADVIL,MOTRIN) 200 MG tablet Take 200 mg by mouth every 4 (four) hours as needed. For pain      . Multiple Vitamins-Minerals (MULTIVITAMINS THER. W/MINERALS) TABS Take 1 tablet by mouth daily.          Allergies Rondec  Family History: Family  History  Problem Relation Age of Onset  . Birth defects Sister   . Hypertension Maternal Grandmother   . Stroke Maternal Grandmother   . Diabetes Maternal Grandfather   . Diabetes Paternal Grandmother   . Hypertension Mother     Social History: History   Social History  . Marital Status: Single    Spouse Name: N/A    Number of Children: 1  . 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 Smoking status: Former Games developer  . Smokeless tobacco: Never Used     Comment: quit mid 2013 but when did on occasion  . Alcohol Use: 0.6 oz/week    1 Glasses of wine per week     Comment: occ wine  . Drug Use: No  . Sexually Active: Yes    Birth Control/ Protection: Implant   Other Topics Concern  . Not on file   Social History Narrative   Caffeine Use-yesRegular exercise-no    Electrocardiogram:  11/20/11 SR rate 65 normal no pre-excitation  Assessment and Plan

## 2012-04-29 NOTE — Assessment & Plan Note (Signed)
Atypical normal ECG check echo R/o structural heart disease No need for eTT

## 2012-04-29 NOTE — Patient Instructions (Signed)
Your physician recommends that you schedule a follow-up appointment in:  AS NEEDED  Your physician recommends that you continue on your current medications as directed. Please refer to the Current Medication list given to you today.  Your physician has requested that you have an echocardiogram. Echocardiography is a painless test that uses sound waves to create images of your heart. It provides your doctor with information about the size and shape of your heart and how well your heart's chambers and valves are working. This procedure takes approximately one hour. There are no restrictions for this procedure.  DX  785.1  Your physician has recommended that you wear an event monitor. Event monitors are medical devices that record the heart's electrical activity. Doctors most often [place] these monitors to diagnose arrhythmias. Arrhythmias are problems with the speed or rhythm of the heartbeat. The monitor is a small, portable device. You can wear one while you do your normal daily activities. This is usually used to diagnose what is causing palpitations/syncope (passing out).  N21 DAY EVENT  DX 785.1

## 2012-04-29 NOTE — Assessment & Plan Note (Signed)
Benign sounding with normal ECG  Frequent enough to try event monitor Doubt SVT

## 2012-04-30 ENCOUNTER — Ambulatory Visit (INDEPENDENT_AMBULATORY_CARE_PROVIDER_SITE_OTHER): Payer: 59 | Admitting: Obstetrics and Gynecology

## 2012-04-30 ENCOUNTER — Encounter: Payer: Self-pay | Admitting: Obstetrics and Gynecology

## 2012-04-30 ENCOUNTER — Telehealth: Payer: Self-pay | Admitting: Obstetrics and Gynecology

## 2012-04-30 VITALS — BP 102/70 | Temp 98.7°F | Ht 64.5 in | Wt 230.0 lb

## 2012-04-30 DIAGNOSIS — N949 Unspecified condition associated with female genital organs and menstrual cycle: Secondary | ICD-10-CM

## 2012-04-30 DIAGNOSIS — R102 Pelvic and perineal pain: Secondary | ICD-10-CM

## 2012-04-30 LAB — POCT URINALYSIS DIPSTICK
Blood, UA: NEGATIVE
Spec Grav, UA: 1.015
Urobilinogen, UA: NEGATIVE

## 2012-04-30 MED ORDER — IBUPROFEN 600 MG PO TABS: 600.0000 mg | ORAL_TABLET | Freq: Four times a day (QID) | ORAL | Status: DC | PRN

## 2012-04-30 NOTE — Progress Notes (Signed)
Contraception: Implanon x 03/2009 History of STD:  history of chlamydia History of ovarian cyst: yes 02/28/2011:   History of fibroids: no History of endometriosis:no Previous ultrasound:yes:    Urinary symptoms: urinary frequency Gastro-intestinal symptoms:  Constipation: no     Diarrhea: no     Nausea: yes with headache     Vomiting: no     Fever: no Vaginal discharge: no vaginal discharge  Subjective:    Elizabeth Stevenson is a 26 y.o. female, [id number], who presents for abdominal pain which started after intercourse last night. Pain scale 10/10 yesterday. 3/10 today. Pain increases when standing and sitting. Laying flat on back helps pain. Cramping feeling, with sharp pains. Pain radiates to rectum and vagina. Normal appetite. Normal BM. Felt nausea yesterday pt states due to migraine. Last cycle in February due to Nexplanon. 1st episode like this.   The following portions of the patient's history were reviewed and updated as appropriate: allergies, current medications, past family history.  Review of Systems Pertinent items are noted in HPI. Gastrointestinal: Negative for abdominal pain, change in bowel habits or rectal bleeding Urinary:negative   Objective:    There were no vitals taken for this visit.    Weight:  Wt Readings from Last 1 Encounters:  04/29/12 228 lb (103.42 kg)          BMI: There is no height or weight on file to calculate BMI.  General Appearance: Alert, appropriate appearance for age. No acute distress HEENT: Grossly normal Neck / Thyroid: Supple, no masses, nodes or enlargement Lungs: clear to auscultation bilaterally Back: No CVA tenderness Cardiovascular: Regular rate and rhythm. S1, S2, no murmur Gastrointestinal: tender LLQ with mild rebound. No guarding Pelvic Exam:  VVC normal. Bimanual exam reveals cervical motion tenderness in LLQ. Ovaries not felt due to body habitus   UPT: negative Chem 10: negative  Assessment:    LLQ pain   Ruptured Ovarian cyst?   Plan:    600 mg Ibuprofen q6hrs Pelvic U/S @ [facility] tomorrow  possible ruptured cyst  Silverio Lay MD

## 2012-04-30 NOTE — Telephone Encounter (Signed)
Pt called, states beginning last night after intercourse started having severe abdominal pain that she rated a 10/10.  To get any relief pt states she had to lay in the fetal position.  Pt also states she started having pain with urination.  States pain felt almost like labor pain, pt denies any vaginal bldg.  Today pain level @ 3/10, but only if she sits a certain way and continues w/ dysuria and is now also having frequency, has used the bathroom so far 16 times today.  Pt scheduled a work in appt today @ 1540 w/ SR.

## 2012-05-01 ENCOUNTER — Encounter: Payer: Self-pay | Admitting: Obstetrics and Gynecology

## 2012-05-01 ENCOUNTER — Ambulatory Visit (INDEPENDENT_AMBULATORY_CARE_PROVIDER_SITE_OTHER): Payer: 59

## 2012-05-01 ENCOUNTER — Ambulatory Visit (INDEPENDENT_AMBULATORY_CARE_PROVIDER_SITE_OTHER): Payer: 59 | Admitting: Obstetrics and Gynecology

## 2012-05-01 VITALS — BP 114/76 | Ht 64.5 in | Wt 230.0 lb

## 2012-05-01 DIAGNOSIS — N83209 Unspecified ovarian cyst, unspecified side: Secondary | ICD-10-CM

## 2012-05-01 DIAGNOSIS — R102 Pelvic and perineal pain: Secondary | ICD-10-CM

## 2012-05-01 DIAGNOSIS — N949 Unspecified condition associated with female genital organs and menstrual cycle: Secondary | ICD-10-CM

## 2012-05-01 NOTE — Progress Notes (Signed)
Subjective:    Elizabeth Stevenson is a 26 y.o. female, [id number], who presents for Gyn ultrasound because of pelvic pain  The following portions of the patient's history were reviewed and updated as appropriate: allergies, current medications, past family history.  Objective:    Ht 5' 4.5" (1.638 m)  Wt 230 lb (104.327 kg)  BMI 38.87 kg/m2    Weight:  Wt Readings from Last 1 Encounters:  05/01/12 230 lb (104.327 kg)          BMI: Body mass index is 38.87 kg/(m^2).  ULTRASOUND: Uterus anteverted    Adnexa normal    Endometrium 4.16 mm    Free fluid: no    Other findings:  Left ovarian mass suggest hemorrhagic cyst 3 cm   Assessment:    left hemorrhagic cyst    Plan:    Ibuprofen every 6 hours for 3 days then PRN Pt aware pain should resolve within 1 week Ultrasound in 4 weeks  Silverio Lay MD

## 2012-05-16 ENCOUNTER — Ambulatory Visit (HOSPITAL_COMMUNITY): Payer: 59 | Attending: Cardiovascular Disease | Admitting: Radiology

## 2012-05-16 ENCOUNTER — Encounter (INDEPENDENT_AMBULATORY_CARE_PROVIDER_SITE_OTHER): Payer: 59

## 2012-05-16 DIAGNOSIS — R002 Palpitations: Secondary | ICD-10-CM

## 2012-05-16 DIAGNOSIS — R072 Precordial pain: Secondary | ICD-10-CM

## 2012-05-16 NOTE — Progress Notes (Unsigned)
Placed a 21 day event monitor on patient and I also went over instructions on how to use and when to return

## 2012-05-16 NOTE — Progress Notes (Signed)
Echocardiogram performed.  

## 2012-05-21 ENCOUNTER — Telehealth: Payer: Self-pay | Admitting: Cardiovascular Disease

## 2012-05-21 NOTE — Telephone Encounter (Signed)
Pt rtning call to Christine from yesterday not sure why

## 2012-05-21 NOTE — Telephone Encounter (Signed)
Patient called echo results given. 

## 2012-05-29 ENCOUNTER — Other Ambulatory Visit: Payer: 59

## 2012-05-29 ENCOUNTER — Encounter: Payer: 59 | Admitting: Obstetrics and Gynecology

## 2012-05-30 ENCOUNTER — Encounter: Payer: 59 | Admitting: Obstetrics and Gynecology

## 2012-05-30 ENCOUNTER — Other Ambulatory Visit: Payer: 59

## 2012-06-02 ENCOUNTER — Other Ambulatory Visit: Payer: Self-pay | Admitting: *Deleted

## 2012-06-02 DIAGNOSIS — E559 Vitamin D deficiency, unspecified: Secondary | ICD-10-CM

## 2012-06-02 MED ORDER — ERGOCALCIFEROL 1.25 MG (50000 UT) PO CAPS: 50000.0000 [IU] | ORAL_CAPSULE | ORAL | Status: DC

## 2012-06-02 NOTE — Telephone Encounter (Signed)
R'cd fax from [facility] for refill of Vitamin D.

## 2012-07-01 ENCOUNTER — Telehealth: Payer: Self-pay | Admitting: Obstetrics and Gynecology

## 2012-07-02 ENCOUNTER — Encounter: Payer: Self-pay | Admitting: Certified Nurse Midwife

## 2012-07-02 ENCOUNTER — Ambulatory Visit: Payer: 59 | Admitting: Certified Nurse Midwife

## 2012-07-02 VITALS — BP 120/64 | Wt 227.0 lb

## 2012-07-02 DIAGNOSIS — N760 Acute vaginitis: Secondary | ICD-10-CM

## 2012-07-02 DIAGNOSIS — Z113 Encounter for screening for infections with a predominantly sexual mode of transmission: Secondary | ICD-10-CM

## 2012-07-02 LAB — POCT WET PREP (WET MOUNT)
Whiff Test: NEGATIVE
pH: 5

## 2012-07-02 MED ORDER — METRONIDAZOLE 500 MG PO TABS: 500.0000 mg | ORAL_TABLET | Freq: Two times a day (BID) | ORAL | Status: DC

## 2012-07-02 MED ORDER — METRONIDAZOLE 500 MG PO TABS: ORAL_TABLET | ORAL | Status: DC

## 2012-07-02 MED ORDER — FLUCONAZOLE 100 MG PO TABS: ORAL_TABLET | ORAL | Status: DC

## 2012-07-02 NOTE — Progress Notes (Signed)
  Pt. Complaints of vag. D/c with odor for 2 weeks on and off.  Partner without problems per report. Desires gc and Chy but denies blood work.  Hasexpired implanon in arm for contraception but advised her She needs to use a back up method until she decides on a form of BC. May stay with the implanon.  o-Alert and oriented  Heart RRR without mummurs  Lungs C To A  Thyroid nl. Abd: OBESE, NON TENDER, NO rbt Back: No CVAT Pelvic: Ext. Labia wnl  Speculum: d/c with odor and grayish color noted             Bimanual: Nl, No masses or tenderness             No CMT Ass:     Bacterial Vaginitis  Prone to monila vaginitis  Expired Implanon  Wet prep pos for whiff, and clue cells    Rx:  Prevention of vaginitis discussed         Flagyl and Diflucan

## 2012-07-03 LAB — GC/CHLAMYDIA PROBE AMP: GC Probe RNA: NEGATIVE

## 2012-07-08 ENCOUNTER — Encounter: Payer: Self-pay | Admitting: Obstetrics and Gynecology

## 2012-07-08 ENCOUNTER — Ambulatory Visit: Payer: 59

## 2012-07-08 ENCOUNTER — Other Ambulatory Visit: Payer: Self-pay | Admitting: Obstetrics and Gynecology

## 2012-07-08 ENCOUNTER — Ambulatory Visit: Payer: 59 | Admitting: Obstetrics and Gynecology

## 2012-07-08 VITALS — BP 98/66 | Ht 64.5 in | Wt 229.0 lb

## 2012-07-08 DIAGNOSIS — N83201 Unspecified ovarian cyst, right side: Secondary | ICD-10-CM

## 2012-07-08 DIAGNOSIS — Z309 Encounter for contraceptive management, unspecified: Secondary | ICD-10-CM

## 2012-07-08 DIAGNOSIS — N83209 Unspecified ovarian cyst, unspecified side: Secondary | ICD-10-CM

## 2012-07-08 DIAGNOSIS — N63 Unspecified lump in unspecified breast: Secondary | ICD-10-CM

## 2012-07-08 NOTE — Progress Notes (Signed)
Subjective:    Elizabeth Stevenson is a 27 y.o. female, [id number], who presents for Gyn ultrasound because of Left Ovarian Cyst.Also concerned about pain in left breast. Finally, needs new Implanon.  The following portions of the patient's history were reviewed and updated as appropriate: allergies, current medications, past family history.  Objective:    LMP 05/23/2012    Weight:  Wt Readings from Last 1 Encounters:  07/02/12 227 lb (102.967 kg)          BMI: There is no weight on file to calculate BMI.  ULTRASOUND: Uterus Anteverted    Adnexa Normal     Endometrium 4.27 mm    Free fluid: no    Other findings:  Normal Left ovary, Right ovarian simple cyst. Resolved left ovarian cyst Breast Exam: pt c/o intermittent pain in Left Breast. Exam shows  Left Breast mass 6:00 Left arm: Nexplanon easily felt   Assessment:    resolved left ovarian cyst but Right simple ovarian cyst de novo  Expired Implanon Left breast mass   Plan:    Repeat ultrasound in 3-4 months AEX due 03/2013 Nexplanon Removal and Reinsertion 07/18/12 at 13:00 Refer to [facility] for Dx ultrasound  Silverio Lay MD

## 2012-07-10 ENCOUNTER — Other Ambulatory Visit: Payer: Self-pay

## 2012-07-10 DIAGNOSIS — N644 Mastodynia: Secondary | ICD-10-CM

## 2012-07-15 ENCOUNTER — Ambulatory Visit
Admission: RE | Admit: 2012-07-15 | Discharge: 2012-07-15 | Disposition: A | Discharge status: Home / Self Care | Payer: 59 | Source: Ambulatory Visit | Attending: Obstetrics and Gynecology | Admitting: Obstetrics and Gynecology

## 2012-07-15 DIAGNOSIS — N644 Mastodynia: Secondary | ICD-10-CM

## 2012-07-18 ENCOUNTER — Ambulatory Visit: Payer: 59

## 2012-07-18 ENCOUNTER — Encounter: Payer: 59 | Admitting: Obstetrics and Gynecology

## 2012-07-22 ENCOUNTER — Telehealth: Payer: Self-pay | Admitting: Obstetrics and Gynecology

## 2012-07-25 ENCOUNTER — Encounter: Payer: Self-pay | Admitting: Obstetrics and Gynecology

## 2012-07-25 ENCOUNTER — Ambulatory Visit: Payer: 59 | Admitting: Obstetrics and Gynecology

## 2012-07-25 VITALS — BP 104/66 | Ht 64.5 in | Wt 228.0 lb

## 2012-07-25 DIAGNOSIS — Z3046 Encounter for surveillance of implantable subdermal contraceptive: Secondary | ICD-10-CM

## 2012-07-25 DIAGNOSIS — Z30017 Encounter for initial prescription of implantable subdermal contraceptive: Secondary | ICD-10-CM

## 2012-07-25 DIAGNOSIS — Z309 Encounter for contraceptive management, unspecified: Secondary | ICD-10-CM

## 2012-07-25 MED ORDER — ETONOGESTREL 68 MG ~~LOC~~ IMPL
68.0000 mg | DRUG_IMPLANT | Freq: Once | SUBCUTANEOUS | Status: AC
  Administered 2012-07-25: 68 mg via SUBCUTANEOUS

## 2012-07-25 NOTE — Progress Notes (Signed)
   NEXPLANON REMOVAL / INSERTION Consent signed   LMP: 07/13/2012  UPT: Negative  Previous Nexplanon user: yes  Prepping  Arm with Betadine. Local anesthesia with 1% buffered Lidocaine. Nexplanon easily removed New Nexplanon inserted per protocol without complication.   LOT: [id number]  EXP: 09/2014  Patient acknowledged position of Nexplanon. Post procedure instructions reviewed.   Follow-up: 2 Dava Najjar Rivard MD 3/14/20141:55 PM

## 2012-09-25 ENCOUNTER — Encounter: Payer: Self-pay | Admitting: Internal Medicine

## 2012-09-25 ENCOUNTER — Other Ambulatory Visit: Payer: Self-pay | Admitting: *Deleted

## 2012-09-25 ENCOUNTER — Other Ambulatory Visit (INDEPENDENT_AMBULATORY_CARE_PROVIDER_SITE_OTHER): Payer: 59

## 2012-09-25 ENCOUNTER — Ambulatory Visit (INDEPENDENT_AMBULATORY_CARE_PROVIDER_SITE_OTHER): Payer: 59 | Admitting: Internal Medicine

## 2012-09-25 VITALS — BP 102/72 | HR 67 | Temp 98.4°F | Ht 64.5 in | Wt 222.0 lb

## 2012-09-25 DIAGNOSIS — R259 Unspecified abnormal involuntary movements: Secondary | ICD-10-CM

## 2012-09-25 DIAGNOSIS — Z566 Other physical and mental strain related to work: Secondary | ICD-10-CM

## 2012-09-25 DIAGNOSIS — E559 Vitamin D deficiency, unspecified: Secondary | ICD-10-CM

## 2012-09-25 DIAGNOSIS — R253 Fasciculation: Secondary | ICD-10-CM

## 2012-09-25 DIAGNOSIS — R5381 Other malaise: Secondary | ICD-10-CM

## 2012-09-25 DIAGNOSIS — R5383 Other fatigue: Secondary | ICD-10-CM

## 2012-09-25 DIAGNOSIS — R635 Abnormal weight gain: Secondary | ICD-10-CM

## 2012-09-25 DIAGNOSIS — Z569 Unspecified problems related to employment: Secondary | ICD-10-CM

## 2012-09-25 LAB — VITAMIN B12: Vitamin B-12: 391 pg/mL (ref 211–911)

## 2012-09-25 LAB — FOLATE: Folate: 10.1 ng/mL (ref >5.9)

## 2012-09-25 NOTE — Patient Instructions (Signed)

## 2012-09-26 ENCOUNTER — Encounter: Payer: Self-pay | Admitting: Internal Medicine

## 2012-09-26 NOTE — Progress Notes (Signed)
Subjective:    Patient ID: Elizabeth Stevenson, female    DOB: [birth year], 27 y.o.   MRN: [id number]  HPI  Pt presents to the clinic today to f/u her vit d level. She finished the ergocalciferol and is now back on OTC supplements 5000 units per day. She stills feels fatigued at times. She reports that intermittently her left eyelid twitches and her hands shakes. She is under a lot of stress with her job. She has no history of low or high blood sugar problems. These problems do not occur often but she is not sure if it is because her vitamins and minerals are imbalanced. She would like to get her Vit D level rechecked today. Additionally, she also has question about weight loss. She is wondering if she can start phentermine. Her diet is very balanced and she does not eat huge portions. She does not exercise. She has lost 5 lbs in the last 2 months. She does have a history of palpitations.  Review of Systems      Past Medical History  Diagnosis Date  . Ovarian cyst   . Pelvic pain   . HSV-1 (herpes simplex virus 1) infection   . HSV-2 (herpes simplex virus 2) infection   . Abnormal Pap smear   . Chlamydia   . Ventricular tachycardia   . Migraine   . Diabetes mellitus without complication   . GERD (gastroesophageal reflux disease)     Current Outpatient Prescriptions  Medication Sig Dispense Refill  . Etonogestrel (NEXPLANON [place]) Inject into the skin.      . Multiple Vitamins-Minerals (MULTIVITAMINS THER. W/MINERALS) TABS Take 1 tablet by mouth daily.         No current facility-administered medications for this visit.    Allergies  Allergen Reactions  . Rondec (Chlorpheniramine-Pseudoeph) Other (See Comments)    Hallucinations.  Not sure which formulation of rondec (brompheniramine vs. Chlorpheniramine)    Family History  Problem Relation Age of Onset  . Birth defects Sister   . Hypertension Maternal Grandmother   . Stroke Maternal Grandmother   . Diabetes Maternal  Grandfather   . Diabetes Paternal Grandmother   . Hypertension Mother     History   Social History  . Marital Status: Single    Spouse Name: N/A    Number of Children: 1  . 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 Smoking status: Former Games developer  . Smokeless tobacco: Never Used     Comment: quit mid 2013 but when did on occasion  . Alcohol Use: 0.6 oz/week    1 Glasses of wine per week     Comment: occ wine  . Drug Use: No  . Sexually Active: Yes    Birth Control/ Protection: Implant   Other Topics Concern  . Not on file   Social History Narrative   Caffeine Use-yes   Regular exercise-no     Constitutional: Pt reports fatigue. Denies fever, malaise, headache or abrupt weight changes.  HEENT: Pt reports twitching of left eyelid. Denies eye pain, eye redness, ear pain, ringing in the ears, wax buildup, runny nose, nasal congestion, bloody nose, or sore throat.  Neurological: Pt reports shaking of bilateral hands. Denies dizziness, difficulty with memory, difficulty with speech or problems with balance and coordination.   No other specific complaints in a complete review of systems (except as listed in HPI above).  Objective:   Physical  Exam   BP 102/72  Pulse 67  Temp(Src) 98.4 F (36.9 C) (Oral)  Ht 5' 4.5" (1.638 m)  Wt 222 lb (100.699 kg)  BMI 37.53 kg/m2  SpO2 97% Wt Readings from Last 3 Encounters:  09/25/12 222 lb (100.699 kg)  07/25/12 228 lb (103.42 kg)  07/08/12 229 lb (103.874 kg)    General: Appears her stated age, obese but well developed, well nourished in NAD. HEENT: Head: normal shape and size; Eyes: sclera white, no icterus, conjunctiva pink, PERRLA and EOMs intact; Ears: Tm's gray and intact, normal light reflex; Nose: mucosa pink and moist, septum midline; Throat/Mouth: Teeth present, mucosa pink and moist, no exudate, lesions or ulcerations noted.   Cardiovascular: Normal rate and rhythm.  S1,S2 noted.  No murmur, rubs or gallops noted. No JVD or BLE edema. No carotid bruits noted. Pulmonary/Chest: Normal effort and positive vesicular breath sounds. No respiratory distress. No wheezes, rales or ronchi noted.  Neurological: Alert and oriented. Cranial nerves II-XII intact. Coordination normal. +DTRs bilaterally. No tremors noted.       Assessment & Plan:   Abnormal weight gain:  Would not be inclined to start phentermine at this time especially d/t pt's history of palpitations Encouraged pt to continue to work on diet changes and start exercising with a goal HR of 150.  Fatigue and vitamin deficiency:  Will recheck Vit D level today as well as B12 and folate  Eye twitching and bilateral hand shaking:  Likely caused by stress Encouraged pt to practice stress relieving techniques such as controlled breathing Pt no ready to start treatment with SSRI at this time  RTC as needed

## 2012-10-08 ENCOUNTER — Telehealth: Payer: Self-pay | Admitting: *Deleted

## 2012-10-08 NOTE — Telephone Encounter (Signed)
Pt was seen at 5/15 OV and had discussed stomach problems with NP and also had spoken recently with OB/GYN about problems. She is having severe abdominal pain at times with prolonged periods of constipation. Pt is requesting NP's advisement on what she can take OTC or prescription or if she needs OV to discuss problem. Pt is having to strain when having BM and is only producing a little.

## 2012-10-09 NOTE — Telephone Encounter (Signed)
I would have her take Miralax OTC daily for the next week and then every other day as needed in addition to some colace stool softener. If she has tried this for a week and its not helping, needs OV to discuss.

## 2012-10-09 NOTE — Telephone Encounter (Signed)
Called pt, no answer/unable to leave message.  

## 2012-10-10 NOTE — Telephone Encounter (Signed)
Called pt, no answer/unable to leave message.  

## 2012-10-10 NOTE — Telephone Encounter (Signed)
Called pt, no answer/unable to leave message (VM is full).

## 2012-10-14 NOTE — Telephone Encounter (Signed)
Called pt, no answer, unable to leave message. (VM is full) Closing phone note until pt returns call.

## 2012-11-19 ENCOUNTER — Encounter: Payer: Self-pay | Admitting: Internal Medicine

## 2012-11-19 ENCOUNTER — Ambulatory Visit (INDEPENDENT_AMBULATORY_CARE_PROVIDER_SITE_OTHER): Payer: 59 | Admitting: Internal Medicine

## 2012-11-19 VITALS — BP 100/72 | HR 60 | Temp 98.7°F

## 2012-11-19 DIAGNOSIS — R3915 Urgency of urination: Secondary | ICD-10-CM

## 2012-11-19 DIAGNOSIS — R350 Frequency of micturition: Secondary | ICD-10-CM

## 2012-11-19 DIAGNOSIS — N3090 Cystitis, unspecified without hematuria: Secondary | ICD-10-CM

## 2012-11-19 DIAGNOSIS — J309 Allergic rhinitis, unspecified: Secondary | ICD-10-CM

## 2012-11-19 DIAGNOSIS — J029 Acute pharyngitis, unspecified: Secondary | ICD-10-CM

## 2012-11-19 LAB — POCT URINALYSIS DIPSTICK
Blood, UA: NEGATIVE
Glucose, UA: NEGATIVE
Nitrite, UA: NEGATIVE
Urobilinogen, UA: 0.2
pH, UA: 6.5

## 2012-11-19 MED ORDER — METHYLPREDNISOLONE ACETATE 80 MG/ML IJ SUSP
80.0000 mg | Freq: Once | INTRAMUSCULAR | Status: AC
  Administered 2012-11-19: 80 mg via INTRAMUSCULAR

## 2012-11-19 NOTE — Patient Instructions (Signed)

## 2012-11-19 NOTE — Addendum Note (Signed)
Addended by: Brenton Grills C on: 11/19/2012 12:17 PM   Modules accepted: Orders

## 2012-11-19 NOTE — Progress Notes (Signed)
Subjective:    Patient ID: Elizabeth Stevenson, female    DOB: 07/13/1985, 27 y.o.   MRN: [id number]  HPI  Pt presents to the clinic today with c/o a sore throat. This started this past weekend. She also c/o bumps in the back of her throat which are itchy. She feels like something is stuck in her throat. She has tried some allergy medicine OTC. She has also gargled with salt water. This did not help. She also c/o urinary urgency and frequency. This started about 2 days ago. She did have a UTI 1 month ago. She denies fever, chills or body aches. She denies nausea and vomiting.  Review of Systems      Past Medical History  Diagnosis Date  . Ovarian cyst   . Pelvic pain   . HSV-1 (herpes simplex virus 1) infection   . HSV-2 (herpes simplex virus 2) infection   . Abnormal Pap smear   . Chlamydia   . Ventricular tachycardia   . Migraine   . Diabetes mellitus without complication   . GERD (gastroesophageal reflux disease)     Current Outpatient Prescriptions  Medication Sig Dispense Refill  . Multiple Vitamins-Minerals (MULTIVITAMINS THER. W/MINERALS) TABS Take 1 tablet by mouth daily.        . Etonogestrel (NEXPLANON [place]) Inject into the skin.       No current facility-administered medications for this visit.    Allergies  Allergen Reactions  . Rondec (Chlorpheniramine-Pseudoeph) Other (See Comments)    Hallucinations.  Not sure which formulation of rondec (brompheniramine vs. Chlorpheniramine)    Family History  Problem Relation Age of Onset  . Birth defects Sister   . Hypertension Maternal Grandmother   . Stroke Maternal Grandmother   . Diabetes Maternal Grandfather   . Diabetes Paternal Grandmother   . Hypertension Mother     History   Social History  . Marital Status: Single    Spouse Name: N/A    Number of Children: 1  . 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 Smoking status: Former Games developer  . Smokeless  tobacco: Never Used     Comment: quit mid 2013 but when did on occasion  . Alcohol Use: 0.6 oz/week    1 Glasses of wine per week     Comment: occ wine  . Drug Use: No  . Sexually Active: Yes    Birth Control/ Protection: Implant   Other Topics Concern  . Not on file   Social History Narrative   Caffeine Use-yes   Regular exercise-no     Constitutional: Denies fever, malaise, fatigue, headache or abrupt weight changes.  HEENT: Pt reports sore throat. Denies eye pain, eye redness, ear pain, ringing in the ears, wax buildup, runny nose, nasal congestion, bloody nose. Respiratory: Denies difficulty breathing, shortness of breath, cough or sputum production.    Gastrointestinal: Denies abdominal pain, bloating, constipation, diarrhea or blood in the stool.  GU: Pt reports urgency and frequency. Denies pain with urination, burning sensation, blood in urine, odor or discharge.  Skin: Denies redness, rashes, lesions or ulcercations.    No other specific complaints in a complete review of systems (except as listed in HPI above).  Objective:   Physical Exam  BP 100/72  Pulse 60  Temp(Src) 98.7 F (37.1 C) (Oral)  SpO2 97% Wt Readings from Last 3 Encounters:  09/25/12 222 lb (100.699 kg)  07/25/12 228 lb (  103.42 kg)  07/08/12 229 lb (103.874 kg)    General: Appears her stated age, overweight but  well developed, well nourished in NAD. Skin: Warm, dry and intact. No rashes, lesions or ulcerations noted. HEENT: Head: normal shape and size; Eyes: sclera white, no icterus, conjunctiva pink, PERRLA and EOMs intact; Ears: Tm's gray and intact, normal light reflex; Nose: mucosa pink and moist, septum midline; Throat/Mouth: Teeth present, mucosa erythematous and moist, no exudate, lesions or ulcerations noted. .  Cardiovascular: Normal rate and rhythm. S1,S2 noted.  No murmur, rubs or gallops noted. No JVD or BLE edema. No carotid bruits noted. Pulmonary/Chest: Normal effort and positive  vesicular breath sounds. No respiratory distress. No wheezes, rales or ronchi noted.  Abdomen: Soft and nontender. Normal bowel sounds, no bruits noted. No distention or masses noted. Liver, spleen and kidneys non palpable. No CVA tenderness.        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

## 2012-12-08 ENCOUNTER — Encounter: Payer: Self-pay | Admitting: Internal Medicine

## 2012-12-08 ENCOUNTER — Ambulatory Visit (INDEPENDENT_AMBULATORY_CARE_PROVIDER_SITE_OTHER): Payer: 59 | Admitting: Internal Medicine

## 2012-12-08 VITALS — BP 120/80 | HR 66 | Temp 98.3°F | Wt 220.0 lb

## 2012-12-08 DIAGNOSIS — H9201 Otalgia, right ear: Secondary | ICD-10-CM

## 2012-12-08 DIAGNOSIS — H6591 Unspecified nonsuppurative otitis media, right ear: Secondary | ICD-10-CM

## 2012-12-08 DIAGNOSIS — H9209 Otalgia, unspecified ear: Secondary | ICD-10-CM

## 2012-12-08 DIAGNOSIS — H7390 Unspecified disorder of tympanic membrane, unspecified ear: Secondary | ICD-10-CM

## 2012-12-08 MED ORDER — ANTIPYRINE-BENZOCAINE 5.4-1.4 % OT SOLN: 3.0000 [drp] | OTIC | Status: DC | PRN

## 2012-12-08 NOTE — Progress Notes (Signed)
Subjective:    Patient ID: Elizabeth Stevenson, female    DOB: 1986-05-10, 27 y.o.   MRN: [id number]  HPI  Pt presents to the clinic today with c/o right ear pain. She also describes it as itchy. This started 2 weeks ago. She also c/o of difficulty hearing out of that ear. She denies fever, chills or body aches. She denies any specific injury to that ear. She has not treated with anything OTC. She does have a history of allergies but has not taken an antihistamine.  Review of Systems      Past Medical History  Diagnosis Date  . Ovarian cyst   . Pelvic pain   . HSV-1 (herpes simplex virus 1) infection   . HSV-2 (herpes simplex virus 2) infection   . Abnormal Pap smear   . Chlamydia   . Ventricular tachycardia   . Migraine   . Diabetes mellitus without complication   . GERD (gastroesophageal reflux disease)     Current Outpatient Prescriptions  Medication Sig Dispense Refill  . Etonogestrel (NEXPLANON [place]) Inject into the skin.      . Multiple Vitamins-Minerals (MULTIVITAMINS THER. W/MINERALS) TABS Take 1 tablet by mouth daily.         No current facility-administered medications for this visit.    Allergies  Allergen Reactions  . Rondec (Chlorpheniramine-Pseudoeph) Other (See Comments)    Hallucinations.  Not sure which formulation of rondec (brompheniramine vs. Chlorpheniramine)    Family History  Problem Relation Age of Onset  . Birth defects Sister   . Hypertension Maternal Grandmother   . Stroke Maternal Grandmother   . Diabetes Maternal Grandfather   . Diabetes Paternal Grandmother   . Hypertension Mother     History   Social History  . Marital Status: Single    Spouse Name: N/A    Number of Children: 1  . 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 Smoking status: Former Games developer  . Smokeless tobacco: Never Used     Comment: quit mid 2013 but when did on occasion  . Alcohol Use: 0.6 oz/week    1  Glasses of wine per week     Comment: occ wine  . Drug Use: No  . Sexually Active: Yes    Birth Control/ Protection: Implant   Other Topics Concern  . Not on file   Social History Narrative   Caffeine Use-yes   Regular exercise-no     Constitutional: Denies fever, malaise, fatigue, headache or abrupt weight changes.  HEENT: Pt reports right ear pain. Denies eye pain, eye redness, ringing in the ears, wax buildup, runny nose, nasal congestion, bloody nose, or sore throat. Respiratory: Denies difficulty breathing, shortness of breath, cough or sputum production.  Neurological: Denies dizziness, difficulty with memory, difficulty with speech or problems with balance and coordination.   No other specific complaints in a complete review of systems (except as listed in HPI above).  Objective:   Physical Exam   BP 120/80  Pulse 66  Temp(Src) 98.3 F (36.8 C) (Oral)  Wt 220 lb (99.791 kg)  BMI 37.19 kg/m2  SpO2 99% Wt Readings from Last 3 Encounters:  12/08/12 220 lb (99.791 kg)  09/25/12 222 lb (100.699 kg)  07/25/12 228 lb (103.42 kg)    General: Appears her stated age, obese but well developed, well nourished in NAD. HEENT: Head: normal shape and size; Eyes: sclera white, no icterus, conjunctiva  pink, PERRLA and EOMs intact; Ears: Tm's gray and intact, normal light reflex, effusion noted behind right ear; Nose: mucosa pink and moist, septum midline; Throat/Mouth: Teeth present, mucosa pink and moist, no exudate, lesions or ulcerations noted.   Cardiovascular: Normal rate and rhythm. S1,S2 noted.  No murmur, rubs or gallops noted. No JVD or BLE edema. No carotid bruits noted. Pulmonary/Chest: Normal effort and positive vesicular breath sounds. No respiratory distress. No wheezes, rales or ronchi noted.   Neurological: Alert and oriented. Cranial nerves II-XII intact. Coordination normal. +DTRs bilaterally.       Assessment & Plan:   Otalgia, right ear secondary to  effusion:  Get an OTC decongestant and antihistamine and take for the next  Days eRx for benzocaine ear drops Monitior for fevers  RTC as needed or if symptoms persist or worsen

## 2012-12-08 NOTE — Patient Instructions (Signed)

## 2012-12-22 ENCOUNTER — Ambulatory Visit (INDEPENDENT_AMBULATORY_CARE_PROVIDER_SITE_OTHER): Payer: 59 | Admitting: Internal Medicine

## 2012-12-22 ENCOUNTER — Encounter: Payer: Self-pay | Admitting: Internal Medicine

## 2012-12-22 VITALS — BP 104/60 | HR 78 | Temp 98.3°F | Ht 64.5 in | Wt 214.0 lb

## 2012-12-22 DIAGNOSIS — J309 Allergic rhinitis, unspecified: Secondary | ICD-10-CM

## 2012-12-22 DIAGNOSIS — J029 Acute pharyngitis, unspecified: Secondary | ICD-10-CM

## 2012-12-22 MED ORDER — PREDNISONE 10 MG PO TABS: ORAL_TABLET | ORAL | Status: DC

## 2012-12-22 NOTE — Progress Notes (Signed)
Subjective:    Patient ID: Elizabeth Stevenson, female    DOB: Sep 25, 1985, 27 y.o.   MRN: [id number]  HPI  Pt presents to the clinic today with c/o sore throat. This started 2 days ago. The pain is not worse with swallowing. She is taking benadryl and allergy medications. She denies fever, chills or body aches. She has not had sick contacts that she is aware of.   Review of Systems  Past Medical History  Diagnosis Date  . Ovarian cyst   . Pelvic pain   . HSV-1 (herpes simplex virus 1) infection   . HSV-2 (herpes simplex virus 2) infection   . Abnormal Pap smear   . Chlamydia   . Ventricular tachycardia   . Migraine   . Diabetes mellitus without complication   . GERD (gastroesophageal reflux disease)     Current Outpatient Prescriptions  Medication Sig Dispense Refill  . antipyrine-benzocaine (AURALGAN) otic solution Place 3 drops into the right ear every 2 (two) hours as needed for pain.  10 mL  0  . Etonogestrel (NEXPLANON ) Inject into the skin.      . Multiple Vitamins-Minerals (MULTIVITAMINS THER. W/MINERALS) TABS Take 1 tablet by mouth daily.         No current facility-administered medications for this visit.    Allergies  Allergen Reactions  . Rondec (Chlorpheniramine-Pseudoeph) Other (See Comments)    Hallucinations.  Not sure which formulation of rondec (brompheniramine vs. Chlorpheniramine)    Family History  Problem Relation Age of Onset  . Birth defects Sister   . Hypertension Maternal Grandmother   . Stroke Maternal Grandmother   . Diabetes Maternal Grandfather   . Diabetes Paternal Grandmother   . Hypertension Mother     History   Social History  . Marital Status: Single    Spouse Name: N/A    Number of Children: 1  . 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 Smoking status: Former Games developer  . Smokeless tobacco: Never Used     Comment: quit mid 2013 but when did on occasion  . Alcohol Use:  0.6 oz/week    1 Glasses of wine per week     Comment: occ wine  . Drug Use: No  . Sexually Active: Yes    Birth Control/ Protection: Implant   Other Topics Concern  . Not on file   Social History Narrative   Caffeine Use-yes   Regular exercise-no     Constitutional: Denies fever, malaise, fatigue, headache or abrupt weight changes.  HEENT: Pt reports sore throat. Denies eye pain, eye redness, ear pain, ringing in the ears, wax buildup, runny nose, nasal congestion, bloody nose. Respiratory: Denies difficulty breathing, shortness of breath, cough or sputum production.     No other specific complaints in a complete review of systems (except as listed in HPI above).     Objective:   Physical Exam   BP 104/60  Pulse 78  Temp(Src) 98.3 F (36.8 C) (Oral)  Ht 5' 4.5" (1.638 m)  Wt 214 lb (97.07 kg)  BMI 36.18 kg/m2  SpO2 98% Wt Readings from Last 3 Encounters:  12/22/12 214 lb (97.07 kg)  12/08/12 220 lb (99.791 kg)  09/25/12 222 lb (100.699 kg)    General: Appears her stated age, well developed, well nourished in NAD. Skin: Warm, dry and intact. No rashes, lesions or ulcerations noted. HEENT: Head: normal shape and size; Eyes:  sclera white, no icterus, conjunctiva pink, PERRLA and EOMs intact; Ears: Tm's gray and intact, normal light reflex; Nose: mucosa pink and moist, septum midline; + PND Throat/Mouth: Teeth present, mucosa pink and moist, no exudate, lesions or ulcerations noted.   Cardiovascular: Normal rate and rhythm. S1,S2 noted.  No murmur, rubs or gallops noted. No JVD or BLE edema. No carotid bruits noted. Pulmonary/Chest: Normal effort and positive vesicular breath sounds. No respiratory distress. No wheezes, rales or ronchi noted.   BMET    Component Value Date/Time   NA 138 04/15/2012 1516   K 4.1 04/15/2012 1516   CL 105 04/15/2012 1516   CO2 27 04/15/2012 1516   GLUCOSE 91 04/15/2012 1516   BUN 9 04/15/2012 1516   CREATININE 0.9 04/15/2012 1516   CALCIUM  8.9 04/15/2012 1516   GFRNONAA >60 12/24/2007 1145   GFRAA  Value: >60        The eGFR has been calculated using the MDRD equation. This calculation has not been validated in all clinical 12/24/2007 1145    Lipid Panel     Component Value Date/Time   CHOL 131 04/15/2012 1516   TRIG 46.0 04/15/2012 1516   HDL 56.90 04/15/2012 1516   CHOLHDL 2 04/15/2012 1516   VLDL 9.2 04/15/2012 1516   LDLCALC 65 04/15/2012 1516    CBC    Component Value Date/Time   WBC 4.9 04/15/2012 1516   RBC 3.97 04/15/2012 1516   HGB 12.4 04/15/2012 1516   HCT 37.2 04/15/2012 1516   PLT 294.0 04/15/2012 1516   MCV 93.8 04/15/2012 1516   MCH 30.9 07/25/2010 2324   MCHC 33.5 04/15/2012 1516   RDW 13.0 04/15/2012 1516   LYMPHSABS 1.7 12/24/2007 1145   MONOABS 0.4 12/24/2007 1145   EOSABS 0.1 12/24/2007 1145   BASOSABS 0.0 12/24/2007 1145    Hgb A1C No results found for this basename: HGBA1C        Assessment & Plan:   Acute pharyngitis secondary to allergic rhinitis:  eRx for pred taper Continue your allergy medication  RTC as needed

## 2012-12-22 NOTE — Patient Instructions (Signed)
Allergic Rhinitis Allergic rhinitis is when the mucous membranes in the nose respond to allergens. Allergens are particles in the air that cause your body to have an allergic reaction. This causes you to release allergic antibodies. Through a chain of events, these eventually cause you to release histamine into the blood stream (hence the use of antihistamines). Although meant to be protective to the body, it is this release that causes your discomfort, such as frequent sneezing, congestion and an itchy runny nose.  CAUSES  The pollen allergens may come from grasses, trees, and weeds. This is seasonal allergic rhinitis, or "hay fever." Other allergens cause year-round allergic rhinitis (perennial allergic rhinitis) such as house dust mite allergen, pet dander and mold spores.  SYMPTOMS   Nasal stuffiness (congestion).  Runny, itchy nose with sneezing and tearing of the eyes.  There is often an itching of the mouth, eyes and ears. It cannot be cured, but it can be controlled with medications. DIAGNOSIS  If you are unable to determine the offending allergen, skin or blood testing may find it. TREATMENT   Avoid the allergen.  Medications and allergy shots (immunotherapy) can help.  Hay fever may often be treated with antihistamines in pill or nasal spray forms. Antihistamines block the effects of histamine. There are over-the-counter medicines that may help with nasal congestion and swelling around the eyes. Check with your caregiver before taking or giving this medicine. If the treatment above does not work, there are many new medications your caregiver can prescribe. Stronger medications may be used if initial measures are ineffective. Desensitizing injections can be used if medications and avoidance fails. Desensitization is when a patient is given ongoing shots until the body becomes less sensitive to the allergen. Make sure you follow up with your caregiver if problems continue. SEEK MEDICAL  CARE IF:   You develop fever (more than 100.5 F (38.1 C).  You develop a cough that does not stop easily (persistent).  You have shortness of breath.  You start wheezing.  Symptoms interfere with normal daily activities. Document Released: 01/23/2001 Document Revised: 07/23/2011 Document Reviewed: 08/04/2008 Exit[employer] Patient Information 2014 [employer].  

## 2013-01-15 ENCOUNTER — Encounter: Payer: Self-pay | Admitting: Internal Medicine

## 2013-01-15 ENCOUNTER — Ambulatory Visit (INDEPENDENT_AMBULATORY_CARE_PROVIDER_SITE_OTHER): Payer: 59 | Admitting: Internal Medicine

## 2013-01-15 VITALS — BP 110/68 | HR 92 | Temp 98.6°F | Wt 217.0 lb

## 2013-01-15 DIAGNOSIS — M79602 Pain in left arm: Secondary | ICD-10-CM

## 2013-01-15 DIAGNOSIS — G8929 Other chronic pain: Secondary | ICD-10-CM

## 2013-01-15 DIAGNOSIS — R071 Chest pain on breathing: Secondary | ICD-10-CM

## 2013-01-15 DIAGNOSIS — M79609 Pain in unspecified limb: Secondary | ICD-10-CM

## 2013-01-15 MED ORDER — MELOXICAM 15 MG PO TABS: 15.0000 mg | ORAL_TABLET | Freq: Every day | ORAL | Status: DC

## 2013-01-15 MED ORDER — PREGABALIN 25 MG PO CAPS: 25.0000 mg | ORAL_CAPSULE | Freq: Three times a day (TID) | ORAL | Status: DC | PRN

## 2013-01-15 NOTE — Progress Notes (Signed)
Subjective:    Patient ID: Elizabeth Stevenson, female    DOB: November 25, 1985, 27 y.o.   MRN: [id number]  Chest Pain  This is a chronic problem. The current episode started more than 1 year ago. The onset quality is gradual. The problem occurs intermittently. The problem has been waxing and waning. The pain is present in the lateral region and substernal region. The pain is moderate. The quality of the pain is described as dull. Radiates to: L breast, left upper arm (site of hormone implant) Associated symptoms include numbness. Pertinent negatives include no abdominal pain, back pain, cough, exertional chest pressure, headaches, irregular heartbeat, lower extremity edema, nausea, near-syncope, palpitations, shortness of breath, syncope or vomiting.   Past Medical History  Diagnosis Date  . Ovarian cyst   . Pelvic pain   . HSV-1 (herpes simplex virus 1) infection   . HSV-2 (herpes simplex virus 2) infection   . Abnormal Pap smear   . Chlamydia   . Ventricular tachycardia   . Migraine   . Diabetes mellitus without complication   . GERD (gastroesophageal reflux disease)      Review of Systems  Respiratory: Negative for cough and shortness of breath.   Cardiovascular: Positive for chest pain. Negative for palpitations, syncope and near-syncope.  Gastrointestinal: Negative for nausea, vomiting and abdominal pain.  Musculoskeletal: Negative for back pain.  Neurological: Positive for numbness. Negative for headaches.        Objective:   Physical Exam BP 110/68  Pulse 92  Temp(Src) 98.6 F (37 C) (Oral)  Wt 217 lb (98.431 kg)  BMI 36.69 kg/m2  SpO2 98% Wt Readings from Last 3 Encounters:  01/15/13 217 lb (98.431 kg)  12/22/12 214 lb (97.07 kg)  12/08/12 220 lb (99.791 kg)    Constitutional: She is overweight, but appears well-developed and well-nourished. No distress.  Neck: Normal range of motion. Neck supple. No JVD present. No thyromegaly present.  Cardiovascular: Normal  rate, regular rhythm and normal heart sounds.  No murmur heard. No BLE edema. Pulmonary/Chest: Effort normal and breath sounds normal. No respiratory distress. She has no wheezes.  MSkel: full range of motion left shoulder with good strength of rotator cuff. No gross deformities or joint effusions. Good bilateral grip strength -no positional reproduction of numbness symptoms at this time Psychiatric: She has a normal mood and affect. Her behavior is normal. Judgment and thought content normal.   Lab Results  Component Value Date   WBC 4.9 04/15/2012   HGB 12.4 04/15/2012   HCT 37.2 04/15/2012   PLT 294.0 04/15/2012   GLUCOSE 91 04/15/2012   CHOL 131 04/15/2012   TRIG 46.0 04/15/2012   HDL 56.90 04/15/2012   LDLCALC 65 04/15/2012   ALT 16 12/24/2007   AST 23 12/24/2007   NA 138 04/15/2012   K 4.1 04/15/2012   CL 105 04/15/2012   CREATININE 0.9 04/15/2012   BUN 9 04/15/2012   CO2 27 04/15/2012   TSH 0.98 04/15/2012   2-D echo January 2014 reviewed -normal, no structural abnormalities January 2014 event monitor for 21 days without arrhythmia      Assessment & Plan:   Chronic substernal and left-sided chest pain. Resume musculoskeletal. Patient reports hormonal swing exacerbation since implantation of BC hormones into left upper arm. Reports breast ultrasound performed to gynecology was normal  Treat with anti-inflammatory -meloxicam daily for 2 weeks, then as needed. Okay to use daily if helpful for this and other musculoskeletal pains and shoulder and back  Okay to use Lyrica as needed for nerve like pain in left upper arm, question exacerbation by implantation of birth control - no evidence for nerve deficit in cervical spine or brachial plexus  Consider follow up with gyn for change to alternate birth control form if symptoms persist despite conservative medication therapy  Education reassurance provided at length today. Reviewed prior testing in the past year showing no evidence for  pulmonary, cardiac or neurologic disease

## 2013-01-15 NOTE — Patient Instructions (Signed)
It was good to see you today. We have reviewed your prior records including labs and tests today Start meloxicam daily for at least 2 weeks - ok to continue daily if helpful for pain Also use Lyrica if needed for "nerve pain" Your prescription(s) have been submitted to your pharmacy. Please take as directed and contact our office if you believe you are having problem(s) with the medication(s). Please schedule followup in 3-6 weeks with Ms Sampson Si, call sooner if problems.

## 2013-01-16 ENCOUNTER — Ambulatory Visit: Payer: 59 | Admitting: Internal Medicine

## 2013-02-13 ENCOUNTER — Ambulatory Visit: Payer: 59 | Admitting: Internal Medicine

## 2013-02-25 ENCOUNTER — Encounter: Payer: Self-pay | Admitting: Internal Medicine

## 2013-02-25 ENCOUNTER — Encounter (HOSPITAL_COMMUNITY): Payer: Self-pay | Admitting: Emergency Medicine

## 2013-02-25 ENCOUNTER — Emergency Department (HOSPITAL_COMMUNITY)
Admission: EM | Admit: 2013-02-25 | Discharge: 2013-02-25 | Disposition: A | Discharge status: Home / Self Care | Payer: 59 | Source: Home / Self Care | Attending: Family Medicine | Admitting: Family Medicine

## 2013-02-25 ENCOUNTER — Ambulatory Visit (INDEPENDENT_AMBULATORY_CARE_PROVIDER_SITE_OTHER): Payer: 59 | Admitting: Internal Medicine

## 2013-02-25 VITALS — BP 108/70 | HR 90 | Temp 99.4°F | Ht 64.5 in | Wt 215.0 lb

## 2013-02-25 DIAGNOSIS — R509 Fever, unspecified: Secondary | ICD-10-CM | POA: Insufficient documentation

## 2013-02-25 DIAGNOSIS — M7660 Achilles tendinitis, unspecified leg: Secondary | ICD-10-CM

## 2013-02-25 DIAGNOSIS — G43909 Migraine, unspecified, not intractable, without status migrainosus: Secondary | ICD-10-CM

## 2013-02-25 DIAGNOSIS — M7662 Achilles tendinitis, left leg: Secondary | ICD-10-CM

## 2013-02-25 DIAGNOSIS — J309 Allergic rhinitis, unspecified: Secondary | ICD-10-CM

## 2013-02-25 MED ORDER — SUMATRIPTAN SUCCINATE 100 MG PO TABS: 100.0000 mg | ORAL_TABLET | ORAL | Status: DC | PRN

## 2013-02-25 MED ORDER — NAPROXEN 500 MG PO TABS: 500.0000 mg | ORAL_TABLET | Freq: Two times a day (BID) | ORAL | Status: DC

## 2013-02-25 MED ORDER — FLUTICASONE PROPIONATE 50 MCG/ACT NA SUSP: 2.0000 | Freq: Every day | NASAL | Status: DC

## 2013-02-25 MED ORDER — CETIRIZINE HCL 10 MG PO TABS: 10.0000 mg | ORAL_TABLET | Freq: Every day | ORAL | Status: DC

## 2013-02-25 NOTE — Progress Notes (Signed)
Subjective:    Patient ID: Elizabeth Stevenson, female    DOB: [birth year], 27 y.o.   MRN: [id number]  HPI  Here with c/o, nervous and frustrated over her seeming recent endless daily new problems at her age, working at [employer] at [employer], but also partime at [employer] standing 4 hrs per shift; now with pain to left post heel and medial foot convinced she may have a fx though no trauma hx.  meloxicam not working.  Also with  2-3 wks now near left daily headaches with throbbing, photophobia, nausea and sometimes left facial numbness.  Is unaware of her low grade temp today, no other sinus pain or colored d/c and Denies urinary symptoms such as dysuria, frequency, urgency, flank pain, hematuria or n/v, fever, chills.  Does have several wks ongoing nasal allergy symptoms with clearish congestion, itch and sneezing, without fever, pain, ST, cough, swelling or wheezing, sometimes uses her daughters flonase, not taking any antihistamine as well. Remains morbid obese.  Quite nervous today though seems to lack insight. Past Medical History  Diagnosis Date  . Ovarian cyst   . Pelvic pain   . HSV-1 (herpes simplex virus 1) infection   . HSV-2 (herpes simplex virus 2) infection   . Abnormal Pap smear   . Chlamydia   . Ventricular tachycardia   . Migraine   . Diabetes mellitus without complication   . GERD (gastroesophageal reflux disease)    Past Surgical History  Procedure Laterality Date  . Colposcopy  2008    has had procedure done couple times    reports that she has quit smoking. She has never used smokeless tobacco. She reports that she drinks about 0.6 ounces of alcohol per week. She reports that she does not use illicit drugs. family history includes Birth defects in her sister; Diabetes in her maternal grandfather and paternal grandmother; Hypertension in her maternal grandmother and mother; Stroke in her maternal grandmother. Allergies  Allergen Reactions  . Rondec  [Chlorpheniramine-Pseudoeph] Other (See Comments)    Hallucinations.  Not sure which formulation of rondec (brompheniramine vs. Chlorpheniramine)   Current Outpatient Prescriptions on File Prior to Visit  Medication Sig Dispense Refill  . Etonogestrel (NEXPLANON [place]) Inject into the skin.      . Multiple Vitamins-Minerals (MULTIVITAMINS THER. W/MINERALS) TABS Take 1 tablet by mouth daily.        . pregabalin (LYRICA) 25 MG capsule Take 1 capsule (25 mg total) by mouth 3 (three) times daily as needed (nerve pain).  90 capsule  0   No current facility-administered medications on file prior to visit.   Review of Systems  Constitutional: Negative for unexpected weight change, or unusual diaphoresis  HENT: Negative for tinnitus.   Eyes: Negative for photophobia and visual disturbance.  Respiratory: Negative for choking and stridor.   Gastrointestinal: Negative for vomiting and blood in stool.  Genitourinary: Negative for hematuria and decreased urine volume.  Musculoskeletal: Negative for acute joint swelling Skin: Negative for color change and wound.  Neurological: Negative for tremors and numbness other than noted  Psychiatric/Behavioral: Negative for decreased concentration or  hyperactivity.       Objective:   Physical Exam BP 108/70  Pulse 90  Temp(Src) 99.4 F (37.4 C) (Oral)  Ht 5' 4.5" (1.638 m)  Wt 215 lb (97.523 kg)  BMI 36.35 kg/m2  SpO2 97% VS noted,  Constitutional: Pt appears well-developed and well-nourished.  HENT: Head: NCAT.  Right Ear: External ear normal.  Left Ear: External ear normal.  Bilat tm's with mild erythema.  Max sinus areas non tender.  Pharynx with mild erythema, no exudate Eyes: Conjunctivae and EOM are normal. Pupils are equal, round, and reactive to light.  Neck: Normal range of motion. Neck supple.  Cardiovascular: Normal rate and regular rhythm.   Pulmonary/Chest: Effort normal and breath sounds normal.  Abd: soft, NT, +BS Neurological: Pt  is alert. Not confused  Skin: Skin is warm. No erythema.  Tender left achilles insertion site and medial tarsal tunnel area Psychiatric: Pt behavior is normal. Thought content normal. 1+nervous    Assessment & Plan:

## 2013-02-25 NOTE — Assessment & Plan Note (Signed)
Exam o/w benign, ok to follow for now for any worsening s/s

## 2013-02-25 NOTE — Assessment & Plan Note (Signed)
Exam benign, for imitrex prn,  to f/u any worsening symptoms or concerns

## 2013-02-25 NOTE — Assessment & Plan Note (Signed)
For zyrtec/flonase as directed,  to f/u any worsening symptoms or concerns

## 2013-02-25 NOTE — ED Notes (Signed)
C/o L ankle pain- over achilles tendon.  She said she saw Dr. Jonny Ruiz at [facility].  C/o headache x 9 mos. But progressively worse and fever onset yesterday.

## 2013-02-25 NOTE — ED Provider Notes (Signed)
CSN: [id number]     Arrival date & time 02/25/13  1934 History   First MD Initiated Contact with Patient 02/25/13 2010     No chief complaint on file.  (Consider location/radiation/quality/duration/timing/severity/associated sxs/prior Treatment) HPI Comments: 27 yo female with left sided HA x several years on/ off without specific triggor. Has noticed tingling HA daily this week no relief with Tylenol. Fever last pm. Also noticed increased Left ankle pain/ swelling x couple of days without strain or injury. PCP today DX migraine and tendonitis and gave prescription for Robaxin 500mg  BID and Imitrex AD but patient has not started these RX.  Denies worse HA of life. + increased pain with head with lights/ computer/ stress. + pain with ROM/ weight bearing of left ankle. Denies current CP, SOB , HA, Dizziness, palpatations    Past Medical History  Diagnosis Date  . Ovarian cyst   . Pelvic pain   . HSV-1 (herpes simplex virus 1) infection   . HSV-2 (herpes simplex virus 2) infection   . Abnormal Pap smear   . Chlamydia   . Ventricular tachycardia   . Migraine   . Diabetes mellitus without complication   . GERD (gastroesophageal reflux disease)    Past Surgical History  Procedure Laterality Date  . Colposcopy  2008    has had procedure done couple times   Family History  Problem Relation Age of Onset  . Birth defects Sister   . Hypertension Maternal Grandmother   . Stroke Maternal Grandmother   . Diabetes Maternal Grandfather   . Diabetes Paternal Grandmother   . Hypertension Mother    History  Substance Use Topics  . Smoking status: Former Games developer  . Smokeless tobacco: Never Used     Comment: quit mid 2013 but when did on occasion  . Alcohol Use: 0.6 oz/week    1 Glasses of wine per week     Comment: occ wine   OB History   Grav Para Term Preterm Abortions TAB SAB Ect Mult Living   3 1 1  0 2 1 1  0 0 1     Review of Systems  Constitutional: Positive for fatigue.  Eyes:  Negative.   Respiratory: Negative.   Cardiovascular: Negative.   Gastrointestinal: Negative.   Musculoskeletal: Positive for joint swelling.  Skin: Negative.   Neurological: Positive for headaches. Negative for dizziness, seizures, facial asymmetry and numbness.  Psychiatric/Behavioral: Negative.     Allergies  Rondec  Home Medications   Current Outpatient Rx  Name  Route  Sig  Dispense  Refill  . cetirizine (ZYRTEC) 10 MG tablet   Oral   Take 1 tablet (10 mg total) by mouth daily.   30 tablet   11   . Etonogestrel (NEXPLANON [place])   Subcutaneous   Inject into the skin.         . fluticasone (FLONASE) 50 MCG/ACT nasal spray   Nasal   Place 2 sprays into the nose daily.   16 g   2   . Multiple Vitamins-Minerals (MULTIVITAMINS THER. W/MINERALS) TABS   Oral   Take 1 tablet by mouth daily.           . naproxen (NAPROSYN) 500 MG tablet   Oral   Take 1 tablet (500 mg total) by mouth 2 (two) times daily with a meal.   60 tablet   2   . pregabalin (LYRICA) 25 MG capsule   Oral   Take 1 capsule (25 mg total) by mouth  3 (three) times daily as needed (nerve pain).   90 capsule   0   . SUMAtriptan (IMITREX) 100 MG tablet   Oral   Take 1 tablet (100 mg total) by mouth every 2 (two) hours as needed for migraine. May repeat in 2 hours if headache persists or recurs.   10 tablet   11    BP 134/82  Pulse 87  Temp(Src) 98.4 F (36.9 C) (Oral)  Resp 16 Physical Exam  Nursing note and vitals reviewed. Constitutional: She is oriented to person, place, and time. She appears well-developed and well-nourished.  HENT:  Head: Normocephalic and atraumatic.  Right Ear: External ear normal.  Left Ear: External ear normal.  Nose: Nose normal.  Mouth/Throat: Oropharynx is clear and moist.  Eyes: Conjunctivae and EOM are normal. Pupils are equal, round, and reactive to light.  Neck: Normal range of motion. Neck supple.  Cardiovascular: Normal rate, regular rhythm, normal  heart sounds and intact distal pulses.   Pulmonary/Chest: Effort normal and breath sounds normal.  Abdominal: Soft.  Musculoskeletal:  Left ankle mild pain with palpation of achilles area. Mild edema at malleolus.  Neurological: She is alert and oriented to person, place, and time. She has normal reflexes. No cranial nerve deficit. Coordination normal.  Skin: Skin is warm and dry.  Psychiatric: She has a normal mood and affect. Her behavior is normal. Judgment normal.    ED Course  Procedures (including critical care time) Labs Review Labs Reviewed - No data to display Imaging Review No results found.    MDM  Elevated BP vs stress vs headache Advised needs F/U PCP for further evaluation explained possibility of each diagnosis. Will take RX given by PCP AD for migraines. Prophylaxis for migraines explained. Possible achilles tendonitis left. Advised REST/ICE/ Elevation. Take Meloxicam AD and/ OR Robaxin AD from PCP.   Berenice Primas, PA-C 02/25/13 2202

## 2013-02-25 NOTE — Assessment & Plan Note (Addendum)
With prob left medial tarsal tunnell as well, for change mobic to naproxyn, I suggested referral to Dr Smith/sport med for prob brace or boot and any other tx needed but she hesitates to take more time away from work; to work on wt loss as well

## 2013-02-25 NOTE — Patient Instructions (Signed)
Please take all new medication as prescribed - the anti-inflammatory, imitrex for headaches, and zyrtec and flonase for allergies  OK to stop the meloxicam  Please continue all other medications as before  Please keep your appointment with Nicki Reaper for next Monday as you have planned  Please call if you change your mind about seeing Dr Smith/sports medicine for the heel and ankle pain

## 2013-02-26 NOTE — ED Provider Notes (Signed)
Medical screening examination/treatment/procedure(s) were performed by non-physician practitioner and as supervising physician I was immediately available for consultation/collaboration.  Leslee Home, M.D.  Reuben Likes, MD 02/26/13 [phone]

## 2013-03-02 ENCOUNTER — Other Ambulatory Visit (INDEPENDENT_AMBULATORY_CARE_PROVIDER_SITE_OTHER): Payer: 59

## 2013-03-02 ENCOUNTER — Encounter: Payer: Self-pay | Admitting: Internal Medicine

## 2013-03-02 ENCOUNTER — Ambulatory Visit (INDEPENDENT_AMBULATORY_CARE_PROVIDER_SITE_OTHER): Payer: 59 | Admitting: Internal Medicine

## 2013-03-02 VITALS — BP 118/90 | HR 74 | Temp 98.4°F | Ht 64.5 in | Wt 217.8 lb

## 2013-03-02 DIAGNOSIS — J029 Acute pharyngitis, unspecified: Secondary | ICD-10-CM

## 2013-03-02 DIAGNOSIS — E669 Obesity, unspecified: Secondary | ICD-10-CM

## 2013-03-02 DIAGNOSIS — M25569 Pain in unspecified knee: Secondary | ICD-10-CM

## 2013-03-02 LAB — HEMOGLOBIN A1C: Hgb A1c MFr Bld: 5.7 % (ref 4.6–6.5)

## 2013-03-02 MED ORDER — METHYLPREDNISOLONE ACETATE 80 MG/ML IJ SUSP
80.0000 mg | Freq: Once | INTRAMUSCULAR | Status: AC
  Administered 2013-03-02: 80 mg via INTRAMUSCULAR

## 2013-03-02 NOTE — Addendum Note (Signed)
Addended by: Bethann Punches E on: 03/02/2013 02:26 PM   Modules accepted: Orders

## 2013-03-02 NOTE — Progress Notes (Signed)
Subjective:    Patient ID: Elizabeth Stevenson, female    DOB: 1985-06-21, 27 y.o.   MRN: [id number]  HPI  Pt presents to the clinic today for month followup for achilles tendonitis. She was seen by Dr. Jonny Ruiz 1 week ago for the same. She was also evaluated by [facility] the next day. She was given naproxen and told to take that instead of the meloxicam. It did help. Her main concern is that they told her she was diabetic. She has never been told that she has diabetes. She is overweight and it does run in both sides of her family. Additionally, she c/o sore throat. This started 2 days ago. She denies fever, chills or body aches. She has been rinsing with Listerine and warm salt water. This has helped.  Review of Systems      Past Medical History  Diagnosis Date  . Ovarian cyst   . Pelvic pain   . HSV-1 (herpes simplex virus 1) infection   . HSV-2 (herpes simplex virus 2) infection   . Abnormal Pap smear   . Chlamydia   . Ventricular tachycardia   . Migraine   . GERD (gastroesophageal reflux disease)     Current Outpatient Prescriptions  Medication Sig Dispense Refill  . cetirizine (ZYRTEC) 10 MG tablet Take 1 tablet (10 mg total) by mouth daily.  30 tablet  11  . Etonogestrel (NEXPLANON [place]) Inject into the skin.      . fluticasone (FLONASE) 50 MCG/ACT nasal spray Place 2 sprays into the nose daily.  16 g  2  . meloxicam (MOBIC) 15 MG tablet Take 15 mg by mouth daily as needed for pain.      . Multiple Vitamins-Minerals (MULTIVITAMINS THER. W/MINERALS) TABS Take 1 tablet by mouth daily.        . naproxen (NAPROSYN) 500 MG tablet Take 1 tablet (500 mg total) by mouth 2 (two) times daily with a meal.  60 tablet  2  . pregabalin (LYRICA) 25 MG capsule Take 1 capsule (25 mg total) by mouth 3 (three) times daily as needed (nerve pain).  90 capsule  0  . SUMAtriptan (IMITREX) 100 MG tablet Take 1 tablet (100 mg total) by mouth every 2 (two) hours as needed for migraine. May repeat in 2 hours if  headache persists or recurs.  10 tablet  11   No current facility-administered medications for this visit.    Allergies  Allergen Reactions  . Rondec [Chlorpheniramine-Pseudoeph] Other (See Comments)    Hallucinations.  Not sure which formulation of rondec (brompheniramine vs. Chlorpheniramine)    Family History  Problem Relation Age of Onset  . Birth defects Sister   . Hypertension Maternal Grandmother   . Stroke Maternal Grandmother   . Diabetes Maternal Grandfather   . Diabetes Paternal Grandmother   . Hypertension Mother     History   Social History  . Marital Status: Single    Spouse Name: N/A    Number of Children: 1  . 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 Smoking status: Current Some Day Smoker    Types: Cigars  . Smokeless tobacco: Never Used     Comment: quit mid 2013 but when did on occasion  . Alcohol Use: 4.2 oz/week    7 Glasses of wine per week     Comment: occ wine  . Drug Use: No  . Sexual Activity: Yes  Birth Control/ Protection: Implant   Other Topics Concern  . Not on file   Social History Narrative   Caffeine Use-yes   Regular exercise-no     Constitutional: Denies fever, malaise, fatigue, headache or abrupt weight changes.  HEENT: Pt reports sore throat. Denies eye pain, eye redness, ear pain, ringing in the ears, wax buildup, runny nose, nasal congestion, bloody nose. Musculoskeletal: Pt reports left ankle pain. Denies decrease in range of motion, difficulty with gait, muscle pain or joint swelling.    No other specific complaints in a complete review of systems (except as listed in HPI above).  Objective:   Physical Exam   BP 118/90  Pulse 74  Temp(Src) 98.4 F (36.9 C) (Oral)  Ht 5' 4.5" (1.638 m)  Wt 217 lb 12 oz (98.771 kg)  BMI 36.81 kg/m2  SpO2 99% Wt Readings from Last 3 Encounters:  03/02/13 217 lb 12 oz (98.771 kg)  02/25/13 215 lb (97.523 kg)  01/15/13  217 lb (98.431 kg)    General: Appears  her stated age, obese but well developed, well nourished in NAD. HEENT: Head: normal shape and size; Eyes: sclera white, no icterus, conjunctiva pink, PERRLA and EOMs intact; Ears: Tm's gray and intact, normal light reflex; Nose: mucosa pink and moist, septum midline; Throat/Mouth: Teeth present, mucosa erythematous and moist, no exudate, lesions or ulcerations noted.  Cardiovascular: Normal rate and rhythm. S1,S2 noted.  No murmur, rubs or gallops noted. No JVD or BLE edema. No carotid bruits noted. Pulmonary/Chest: Normal effort and positive vesicular breath sounds. No respiratory distress. No wheezes, rales or ronchi noted.  Musculoskeletal: Normal range of motion. No signs of joint swelling. No difficulty with gait.   BMET    Component Value Date/Time   NA 138 04/15/2012 1516   K 4.1 04/15/2012 1516   CL 105 04/15/2012 1516   CO2 27 04/15/2012 1516   GLUCOSE 91 04/15/2012 1516   BUN 9 04/15/2012 1516   CREATININE 0.9 04/15/2012 1516   CALCIUM 8.9 04/15/2012 1516   GFRNONAA >60 12/24/2007 1145   GFRAA  Value: >60        The eGFR has been calculated using the MDRD equation. This calculation has not been validated in all clinical 12/24/2007 1145    Lipid Panel     Component Value Date/Time   CHOL 131 04/15/2012 1516   TRIG 46.0 04/15/2012 1516   HDL 56.90 04/15/2012 1516   CHOLHDL 2 04/15/2012 1516   VLDL 9.2 04/15/2012 1516   LDLCALC 65 04/15/2012 1516    CBC    Component Value Date/Time   WBC 4.9 04/15/2012 1516   RBC 3.97 04/15/2012 1516   HGB 12.4 04/15/2012 1516   HCT 37.2 04/15/2012 1516   PLT 294.0 04/15/2012 1516   MCV 93.8 04/15/2012 1516   MCH 30.9 07/25/2010 2324   MCHC 33.5 04/15/2012 1516   RDW 13.0 04/15/2012 1516   LYMPHSABS 1.7 12/24/2007 1145   MONOABS 0.4 12/24/2007 1145   EOSABS 0.1 12/24/2007 1145   BASOSABS 0.0 12/24/2007 1145    Hgb A1C No results found for this basename: HGBA1C        Assessment & Plan:   Viral  pharyngitis:  Continue supportive care Will give 80 mg Depo IM today for comfort Watch for fever, increased pain or swelling  Left ankle tendonitis:   Controlled on naprosyn Will check ESR per pt request  Obesity with family hx of DM2:  Will check A1C per pt request  RTC as needed- we will call you with the lab results

## 2013-03-02 NOTE — Patient Instructions (Signed)
Viral Pharyngitis Viral pharyngitis is a viral infection that produces redness, pain, and swelling (inflammation) of the throat. It can spread from person to person (contagious). CAUSES Viral pharyngitis is caused by inhaling a large amount of certain germs called viruses. Many different viruses cause viral pharyngitis. SYMPTOMS Symptoms of viral pharyngitis include:  Sore throat.  Tiredness.  Stuffy nose.  Low-grade fever.  Congestion.  Cough. TREATMENT Treatment includes rest, drinking plenty of fluids, and the use of over-the-counter medication (approved by your caregiver). HOME CARE INSTRUCTIONS   Drink enough fluids to keep your urine clear or pale yellow.  Eat soft, cold foods such as ice cream, frozen ice pops, or gelatin dessert.  Gargle with warm salt water (1 tsp salt per 1 qt of water).  If over age 7, throat lozenges may be used safely.  Only take over-the-counter or prescription medicines for pain, discomfort, or fever as directed by your caregiver. Do not take aspirin. To help prevent spreading viral pharyngitis to others, avoid:  Mouth-to-mouth contact with others.  Sharing utensils for eating and drinking.  Coughing around others. SEEK MEDICAL CARE IF:   You are better in a few days, then become worse.  You have a fever or pain not helped by pain medicines.  There are any other changes that concern you. Document Released: 02/07/2005 Document Revised: 07/23/2011 Document Reviewed: 07/06/2010 ExitCare Patient Information 2014 [employer].  

## 2013-03-05 ENCOUNTER — Encounter: Payer: Self-pay | Admitting: Internal Medicine

## 2013-03-05 ENCOUNTER — Telehealth: Payer: Self-pay | Admitting: *Deleted

## 2013-03-05 ENCOUNTER — Ambulatory Visit (INDEPENDENT_AMBULATORY_CARE_PROVIDER_SITE_OTHER): Payer: 59 | Admitting: Internal Medicine

## 2013-03-05 VITALS — BP 100/62 | HR 73 | Temp 98.9°F | Wt 217.5 lb

## 2013-03-05 DIAGNOSIS — B379 Candidiasis, unspecified: Secondary | ICD-10-CM

## 2013-03-05 DIAGNOSIS — J020 Streptococcal pharyngitis: Secondary | ICD-10-CM

## 2013-03-05 LAB — POCT RAPID STREP A (OFFICE): Rapid Strep A Screen: NEGATIVE

## 2013-03-05 MED ORDER — FLUCONAZOLE 150 MG PO TABS: 150.0000 mg | ORAL_TABLET | Freq: Once | ORAL | Status: DC

## 2013-03-05 MED ORDER — AMOXICILLIN 500 MG PO CAPS: 500.0000 mg | ORAL_CAPSULE | Freq: Three times a day (TID) | ORAL | Status: DC

## 2013-03-05 NOTE — Patient Instructions (Signed)
Strep Throat  Strep throat is an infection of the throat caused by a bacteria named Streptococcus pyogenes. Your caregiver may call the infection streptococcal "tonsillitis" or "pharyngitis" depending on whether there are signs of inflammation in the tonsils or back of the throat. Strep throat is most common in children aged 25 15 years during the cold months of the year, but it can occur in people of any age during any season. This infection is spread from person to person (contagious) through coughing, sneezing, or other close contact.  SYMPTOMS   · Fever or chills.  · Painful, swollen, red tonsils or throat.  · Pain or difficulty when swallowing.  · White or yellow spots on the tonsils or throat.  · Swollen, tender lymph nodes or "glands" of the neck or under the jaw.  · Red rash all over the body (rare).  DIAGNOSIS   Many different infections can cause the same symptoms. A test must be done to confirm the diagnosis so the right treatment can be given. A "rapid strep test" can help your caregiver make the diagnosis in a few minutes. If this test is not available, a light swab of the infected area can be used for a throat culture test. If a throat culture test is done, results are usually available in a day or two.  TREATMENT   Strep throat is treated with antibiotic medicine.  HOME CARE INSTRUCTIONS   · Gargle with 1 tsp of salt in 1 cup of warm water, 3 4 times per day or as needed for comfort.  · Family members who also have a sore throat or fever should be tested for strep throat and treated with antibiotics if they have the strep infection.  · Make sure everyone in your household washes their hands well.  · Do not share food, drinking cups, or personal items that could cause the infection to spread to others.  · You may need to eat a soft food diet until your sore throat gets better.  · Drink enough water and fluids to keep your urine clear or pale yellow. This will help prevent dehydration.  · Get plenty of  rest.  · Stay home from school, daycare, or work until you have been on antibiotics for 24 hours.  · Only take over-the-counter or prescription medicines for pain, discomfort, or fever as directed by your caregiver.  · If antibiotics are prescribed, take them as directed. Finish them even if you start to feel better.  SEEK MEDICAL CARE IF:   · The glands in your neck continue to enlarge.  · You develop a rash, cough, or earache.  · You cough up green, yellow-brown, or bloody sputum.  · You have pain or discomfort not controlled by medicines.  · Your problems seem to be getting worse rather than better.  SEEK IMMEDIATE MEDICAL CARE IF:   · You develop any new symptoms such as vomiting, severe headache, stiff or painful neck, chest pain, shortness of breath, or trouble swallowing.  · You develop severe throat pain, drooling, or changes in your voice.  · You develop swelling of the neck, or the skin on the neck becomes red and tender.  · You have a fever.  · You develop signs of dehydration, such as fatigue, dry mouth, and decreased urination.  · You become increasingly sleepy, or you cannot wake up completely.  Document Released: 04/27/2000 Document Revised: 04/16/2012 Document Reviewed: 06/29/2010  ExitCare® Patient Information ©2014 [employer].

## 2013-03-05 NOTE — Progress Notes (Signed)
HPI  Pt presents to the clinic today with c/o sore throat x 5 days. She also has fatigue, fever and headache. She was seen 3 days ago, diagnosed with viral pharyngitis and given a steroid injection. She reports that this dose not help. She is still rinsing with warm salt water, taking Advil. She does have a history of allergies but no breathing problems. She has had sick contacts.  Review of Systems      Past Medical History  Diagnosis Date  . Ovarian cyst   . Pelvic pain   . HSV-1 (herpes simplex virus 1) infection   . HSV-2 (herpes simplex virus 2) infection   . Abnormal Pap smear   . Chlamydia   . Ventricular tachycardia   . Migraine   . GERD (gastroesophageal reflux disease)     Family History  Problem Relation Age of Onset  . Birth defects Sister   . Hypertension Maternal Grandmother   . Stroke Maternal Grandmother   . Diabetes Maternal Grandfather   . Diabetes Paternal Grandmother   . Hypertension Mother     History   Social History  . Marital Status: Single    Spouse Name: N/A    Number of Children: 1  . 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 Smoking status: Current Some Day Smoker    Types: Cigars  . Smokeless tobacco: Never Used     Comment: quit mid 2013 but when did on occasion  . Alcohol Use: 4.2 oz/week    7 Glasses of wine per week     Comment: occ wine  . Drug Use: No  . Sexual Activity: Yes    Birth Control/ Protection: Implant   Other Topics Concern  . Not on file   Social History Narrative   Caffeine Use-yes   Regular exercise-no    Allergies  Allergen Reactions  . Rondec [Chlorpheniramine-Pseudoeph] Other (See Comments)    Hallucinations.  Not sure which formulation of rondec (brompheniramine vs. Chlorpheniramine)     Constitutional: Positive headache, fatigue and fever. Denies abrupt weight changes.  HEENT:  Positive sore throat. Denies eye redness, eye pain, pressure behind  the eyes, facial pain, nasal congestion, ear pain, ringing in the ears, wax buildup, runny nose or bloody nose. Respiratory: Positive cough. Denies difficulty breathing or shortness of breath.  Cardiovascular: Denies chest pain, chest tightness, palpitations or swelling in the hands or feet.   No other specific complaints in a complete review of systems (except as listed in HPI above).  Objective:   BP 100/62  Pulse 73  Temp(Src) 98.9 F (37.2 C) (Oral)  Wt 217 lb 8 oz (98.657 kg)  BMI 36.77 kg/m2  SpO2 96% Wt Readings from Last 3 Encounters:  03/05/13 217 lb 8 oz (98.657 kg)  03/02/13 217 lb 12 oz (98.771 kg)  02/25/13 215 lb (97.523 kg)     General: Appears her stated age, obese but well developed, well nourished in NAD. HEENT: Head: normal shape and size; Eyes: sclera white, no icterus, conjunctiva pink, PERRLA and EOMs intact; Ears: Tm's gray and intact, normal light reflex; Nose: mucosa pink and moist, septum midline; Throat/Mouth: + PND. Teeth present, mucosa erythematous and moist, no exudate noted, no lesions or ulcerations noted.  Neck: Mild tonsillarlymphadenopathy. Neck supple, trachea midline. No massses, lumps or thyromegaly present.  Cardiovascular: Normal rate and rhythm. S1,S2 noted.  No murmur, rubs or gallops noted. No JVD  or BLE edema. No carotid bruits noted. Pulmonary/Chest: Normal effort and positive vesicular breath sounds. No respiratory distress. No wheezes, rales or ronchi noted.      Assessment & Plan:   Bacterial pharyngtis:  Get some rest and drink plenty of water Do salt water gargles for the sore throat RST-negative Given worsening symptoms will treat with amoxil TID x 10 days eRx for Diflucan for antibiotic induced yeast infection  RTC as needed or if symptoms persist.

## 2013-03-05 NOTE — Telephone Encounter (Signed)
Pt called states her symptoms are worsening.  Please advise

## 2013-03-05 NOTE — Telephone Encounter (Signed)
Left message on VM for pt to return call to [facility]. 

## 2013-03-05 NOTE — Telephone Encounter (Signed)
Did the steroid injection help at all? We could call her in a pred taper if the injection helped

## 2013-03-19 ENCOUNTER — Other Ambulatory Visit: Payer: Self-pay

## 2013-06-11 ENCOUNTER — Encounter: Payer: Self-pay | Admitting: Internal Medicine

## 2013-06-11 ENCOUNTER — Ambulatory Visit (INDEPENDENT_AMBULATORY_CARE_PROVIDER_SITE_OTHER): Payer: 59 | Admitting: Internal Medicine

## 2013-06-11 VITALS — BP 102/69 | HR 70 | Temp 98.7°F | Wt 226.2 lb

## 2013-06-11 DIAGNOSIS — R51 Headache: Secondary | ICD-10-CM

## 2013-06-11 DIAGNOSIS — R519 Headache, unspecified: Secondary | ICD-10-CM

## 2013-06-11 MED ORDER — TRAMADOL HCL 50 MG PO TABS: 50.0000 mg | ORAL_TABLET | Freq: Four times a day (QID) | ORAL | Status: DC | PRN

## 2013-06-11 NOTE — Patient Instructions (Signed)

## 2013-06-11 NOTE — Progress Notes (Signed)
Subjective:    Patient ID: Elizabeth Stevenson, female    DOB: 1985/10/12, 28 y.o.   MRN: [id number]  HPI   She's had intermittent left temporal area headaches described as a pressure phenomena for 3 weeks. She questioned whether he might be related to her eyeglasses being too tight but not wearing did not make an impact.It has become a dull ache lasting hours.  She has been taking Naprosyn on average once daily  She typically will drink 2-3 cups of coffee a day or 2 cups of tea. She does not ingest cola.  She does smoke a cigarillo daily  She has a history of migraines; she took a tryptan but this did not relieve the headache & actually made it worse  She does note sensitivity to light or sound.  Unrelated to this test is tinnitus  Also unrelated is intermittent vertigo  Intermittently she's had some weakness, numbness, tingling in her extremities. 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      Review of Systems   She denies rhinitis; she has had OS excessive tearing .  She has not had diplopia or loss of vision  She also denies signs of rhinosinusitis such as frontal sinus or facial sinus pain or nasal purulence  There no associated fever, chills, sweats  There has been no change in color or temperature of the skin in the area of the headache.  She has no abnormal bruising or bleeding     Objective:   Physical Exam Gen.: Healthy and well-nourished in appearance. Alert, appropriate and cooperative throughout exam.Appears younger than stated age  Head: Normocephalic without obvious abnormalities .Tender to palpation L upper temple Eyes: No corneal or conjunctival inflammation noted. Pupils equal round reactive to light and accommodation. Extraocular motion intact. Light sensitivity. Vision grossly normal without lenses Ears: External  ear exam reveals no significant lesions or deformities.  Canals clear .TMs normal. Hearing is grossly normal bilaterally.Tuning fork exam normal. Nose: External nasal exam reveals no deformity or inflammation. Nasal mucosa are pink and moist. No lesions or exudates noted. Nasal jewelry on R  Mouth: Oral mucosa and oropharynx reveal no lesions or exudates. Teeth in good repair. Slight crepitus R TMJ. Neck: No deformities, masses, or tenderness noted. Range of motion normal. Lungs: Normal respiratory effort; chest expands symmetrically. Lungs are clear to auscultation without rales, wheezes, or increased work of breathing. Heart: Normal rate and rhythm. Normal S1 and S2. No gallop, click, or rub. No murmur.                                   Musculoskeletal/extremities: No deformity or scoliosis noted of  the thoracic or lumbar spine.  No clubbing, cyanosis, edema, or significant extremity  deformity noted. Range of motion normal .Tone & strength normal. Hand joints normal . Fingernail health good.  Vascular: Carotid, radial artery, dorsalis pedis and  posterior tibial pulses are full and equal. No bruits present. Neurologic: Alert and oriented x3. Deep tendon reflexes symmetrical and normal.  Gait normal  . Rhomberg & finger to nose       Skin: Intact without suspicious lesions or rashes. Lymph: No cervical, axillary lymphadenopathy present. Psych: Mood and affect are normal. Normally interactive  Assessment & Plan:  #1 temporal headache, R/O temporal arteritis See orders

## 2013-06-11 NOTE — Progress Notes (Signed)
Pre visit review using our clinic review tool, if applicable. No additional management support is needed unless otherwise documented below in the visit note. 

## 2013-06-12 ENCOUNTER — Telehealth: Payer: Self-pay | Admitting: Internal Medicine

## 2013-06-12 LAB — SEDIMENTATION RATE: Sed Rate: 13 mm/hr (ref 0–22)

## 2013-06-12 NOTE — Telephone Encounter (Signed)
Relevant patient education assigned to patient using Emmi. ° °

## 2013-06-24 ENCOUNTER — Ambulatory Visit (INDEPENDENT_AMBULATORY_CARE_PROVIDER_SITE_OTHER): Payer: 59 | Admitting: Physician Assistant

## 2013-06-24 ENCOUNTER — Encounter: Payer: Self-pay | Admitting: Physician Assistant

## 2013-06-24 ENCOUNTER — Other Ambulatory Visit (INDEPENDENT_AMBULATORY_CARE_PROVIDER_SITE_OTHER): Payer: 59

## 2013-06-24 VITALS — BP 98/78 | HR 72 | Temp 98.9°F | Resp 16 | Ht 64.5 in | Wt 229.8 lb

## 2013-06-24 DIAGNOSIS — IMO0001 Reserved for inherently not codable concepts without codable children: Secondary | ICD-10-CM

## 2013-06-24 DIAGNOSIS — M791 Myalgia, unspecified site: Secondary | ICD-10-CM

## 2013-06-24 DIAGNOSIS — R209 Unspecified disturbances of skin sensation: Secondary | ICD-10-CM

## 2013-06-24 DIAGNOSIS — R202 Paresthesia of skin: Secondary | ICD-10-CM

## 2013-06-24 DIAGNOSIS — J029 Acute pharyngitis, unspecified: Secondary | ICD-10-CM

## 2013-06-24 LAB — CBC WITH DIFFERENTIAL/PLATELET
BASOS ABS: 0.1 10*3/uL (ref 0.0–0.1)
Basophils Relative: 0.8 % (ref 0.0–3.0)
EOS PCT: 1.7 % (ref 0.0–5.0)
Eosinophils Absolute: 0.1 10*3/uL (ref 0.0–0.7)
HEMATOCRIT: 39.7 % (ref 36.0–46.0)
Hemoglobin: 13.1 g/dL (ref 12.0–15.0)
LYMPHS ABS: 2.7 10*3/uL (ref 0.7–4.0)
Lymphocytes Relative: 43.3 % (ref 12.0–46.0)
MCHC: 33 g/dL (ref 30.0–36.0)
MCV: 95.7 fl (ref 78.0–100.0)
Monocytes Absolute: 0.5 10*3/uL (ref 0.1–1.0)
Monocytes Relative: 7.8 % (ref 3.0–12.0)
NEUTROS PCT: 46.4 % (ref 43.0–77.0)
Neutro Abs: 2.9 10*3/uL (ref 1.4–7.7)
PLATELETS: 283 10*3/uL (ref 150.0–400.0)
RBC: 4.15 Mil/uL (ref 3.87–5.11)
RDW: 13.3 % (ref 11.5–14.6)
WBC: 6.3 10*3/uL (ref 4.5–10.5)

## 2013-06-24 LAB — POCT RAPID STREP A (OFFICE): RAPID STREP A SCREEN: NEGATIVE

## 2013-06-24 LAB — BASIC METABOLIC PANEL
BUN: 11 mg/dL (ref 6–23)
CO2: 27 meq/L (ref 19–32)
CREATININE: 0.8 mg/dL (ref 0.4–1.2)
Calcium: 9.2 mg/dL (ref 8.4–10.5)
Chloride: 106 mEq/L (ref 96–112)
GFR: 108.49 mL/min (ref >60.00)
Glucose, Bld: 83 mg/dL (ref 70–99)
Potassium: 3.9 mEq/L (ref 3.5–5.1)
SODIUM: 139 meq/L (ref 135–145)

## 2013-06-24 LAB — CK: Total CK: 95 U/L (ref 7–177)

## 2013-06-24 LAB — CALCIUM: CALCIUM: 9.2 mg/dL (ref 8.4–10.5)

## 2013-06-24 NOTE — Progress Notes (Signed)
Pre visit review using our clinic review tool, if applicable. No additional management support is needed unless otherwise documented below in the visit note/SLS  

## 2013-06-24 NOTE — Patient Instructions (Signed)
It was great to meet you today Ms. Peitz!   Pharyngitis Pharyngitis is a sore throat (pharynx). There is redness, pain, and swelling of your throat. HOME CARE   Drink enough fluids to keep your pee (urine) clear or pale yellow.  Only take medicine as told by your doctor.  You may get sick again if you do not take medicine as told. Finish your medicines, even if you start to feel better.  Do not take aspirin.  Rest.  Rinse your mouth (gargle) with salt water ( tsp of salt per 1 qt of water) every 1 2 hours. This will help the pain.  If you are not at risk for choking, you can suck on hard candy or sore throat lozenges. GET HELP IF:  You have large, tender lumps on your neck.  You have a rash.  You cough up green, yellow-brown, or bloody spit. GET HELP RIGHT AWAY IF:   You have a stiff neck.  You drool or cannot swallow liquids.  You throw up (vomit) or are not able to keep medicine or liquids down.  You have very bad pain that does not go away with medicine.  You have problems breathing (not from a stuffy nose). MAKE SURE YOU:   Understand these instructions.  Will watch your condition.  Will get help right away if you are not doing well or get worse. Document Released: 10/17/2007 Document Revised: 02/18/2013 Document Reviewed: 01/05/2013 [facility] Patient Information 2014 [place], [place].     Paresthesia Paresthesia is a burning or prickling feeling. This feeling can happen in any part of the body. It often happens in the hands, arms, legs, or feet. HOME CARE  Avoid drinking alcohol.  Try massage or needle therapy (acupuncture) to help with your problems.  Keep all doctor visits as told. GET HELP RIGHT AWAY IF:   You feel weak.  You have trouble walking or moving.  You have problems speaking or seeing.  You feel confused.  You cannot control when you poop (bowel movement) or pee (urinate).  You lose feeling (numbness) after an injury.  You  pass out (faint).  Your burning or prickling feeling gets worse when you walk.  You have pain, cramps, or feel dizzy.  You have a rash. MAKE SURE YOU:   Understand these instructions.  Will watch your condition.  Will get help right away if you are not doing well or get worse. Document Released: 04/12/2008 Document Revised: 07/23/2011 Document Reviewed: 01/19/2011 [facility] Patient Information 2014 [place], [place].     Myalgia, Adult Myalgia is the medical term for muscle pain. It is a symptom of many things. Nearly everyone at some time in their life has this. The most common cause for muscle pain is overuse or straining and more so when you are not in shape. Injuries and muscle bruises cause myalgias. Muscle pain without a history of injury can also be caused by a virus. It frequently comes along with the flu. Myalgia not caused by muscle strain can be present in a large number of infectious diseases. Some autoimmune diseases like lupus and fibromyalgia can cause muscle pain. Myalgia may be mild, or severe. SYMPTOMS  The symptoms of myalgia are simply muscle pain. Most of the time this is short lived and the pain goes away without treatment. DIAGNOSIS  Myalgia is diagnosed by your caregiver by taking your history. This means you tell him when the problems began, what they are, and what has been happening. If this has  not been a long term problem, your caregiver may want to watch for a while to see what will happen. If it has been long term, they may want to do additional testing. TREATMENT  The treatment depends on what the underlying cause of the muscle pain is. Often anti-inflammatory medications will help. HOME CARE INSTRUCTIONS  If the pain in your muscles came from overuse, slow down your activities until the problems go away.  Myalgia from overuse of a muscle can be treated with alternating hot and cold packs on the muscle affected or with cold for the first couple days. If  either heat or cold seems to make things worse, stop their use.  Apply ice to the sore area for 15-20 minutes, 03-04 times per day, while awake for the first 2 days of muscle soreness, or as directed. Put the ice in a plastic bag and place a towel between the bag of ice and your skin.  Only take over-the-counter or prescription medicines for pain, discomfort, or fever as directed by your caregiver.  Regular gentle exercise may help if you are not active.  Stretching before strenuous exercise can help lower the risk of myalgia. It is normal when beginning an exercise regimen to feel some muscle pain after exercising. Muscles that have not been used frequently will be sore at first. If the pain is extreme, this may mean injury to a muscle. SEEK MEDICAL CARE IF:  You have an increase in muscle pain that is not relieved with medication.  You begin to run a temperature.  You develop nausea and vomiting.  You develop a stiff and painful neck.  You develop a rash.  You develop muscle pain after a tick bite.  You have continued muscle pain while working out even after you are in good condition. SEEK IMMEDIATE MEDICAL CARE IF: Any of your problems are getting worse and medications are not helping. MAKE SURE YOU:   Understand these instructions.  Will watch your condition.  Will get help right away if you are not doing well or get worse. Document Released: 03/22/2006 Document Revised: 07/23/2011 Document Reviewed: 06/11/2006 [facility] Patient Information 2014 [place], [place].

## 2013-06-24 NOTE — Progress Notes (Signed)
Subjective:    Patient ID: Elizabeth Stevenson, female    DOB: Dec 29, 1985, 28 y.o.   MRN: [id number]  HPI Comments: Patient is a 28 year old female who presents to the clinic with complaint of sore throat. Patient reports she has had a sore throat for the past 3-4 days, with a subjective low grade fever two days ago. Reports she has had multiple episodes of strep in the past and is concerned may have strep throat again. Has noticed white spots in the back of her throat. Patient also reports ongoing intermittent pains that randomly happen to different areas of her body. States she may one day have a pain in her right flank then the next day in her left leg on the shin. States has had times when she experiences pain in her left arm with tingling in her left hand and then in her right hand. Reports has had a xray of her neck and back with negative findings. Has used nothing for pain or symptom relief.  Denies chest pain/palpitaions, rhinorrhea, congestion, ear pain or fullness, N/V, diarrhea or constipation. Denies known injury to neck.   Review of Systems  Constitutional: Positive for fever (subjective, resolved now). Negative for chills.  HENT: Positive for sore throat. Negative for congestion, dental problem, ear pain, rhinorrhea, sinus pressure, trouble swallowing and voice change.   Respiratory: Negative for cough, shortness of breath and wheezing.   Gastrointestinal: Negative for nausea, vomiting, diarrhea and constipation.  Musculoskeletal: Positive for myalgias.  Skin: Negative for rash.  Neurological: Positive for numbness (intermittent, bilateral UE). Negative for dizziness, weakness and light-headedness.  All other systems reviewed and are negative.   Past Medical History  Diagnosis Date  . Ovarian cyst   . Pelvic pain   . HSV-1 (herpes simplex virus 1) infection   . HSV-2 (herpes simplex virus 2) infection   . Abnormal Pap smear   . Chlamydia   . Ventricular tachycardia   .  Migraine   . GERD (gastroesophageal reflux disease)        Objective:   Physical Exam  Vitals reviewed. Constitutional: She is oriented to person, place, and time. She appears well-developed and well-nourished. No distress.  HENT:  Head: Normocephalic and atraumatic.  Right Ear: Hearing, tympanic membrane, external ear and ear canal normal. No tenderness.  Left Ear: Tympanic membrane, external ear and ear canal normal. No tenderness.  Mouth/Throat: Uvula is midline and mucous membranes are normal. Oropharyngeal exudate (one spot of possible exudate noted to left tonsilar area, possible crypt) and posterior oropharyngeal erythema present. No posterior oropharyngeal edema or tonsillar abscesses.  FROM U/LE bilateral  Eyes: Conjunctivae are normal.  Neck: Normal range of motion.  Cardiovascular: Normal rate, regular rhythm, normal heart sounds and intact distal pulses.  Exam reveals no gallop and no friction rub.   No murmur heard. Pulmonary/Chest: Effort normal and breath sounds normal. She has no wheezes. She has no rales.  Musculoskeletal: Normal range of motion.  FROM U/LE bilateral, non reproducible pain. U/LE bilateral DNVI.   Lymphadenopathy:    She has no cervical adenopathy.       Right: No supraclavicular adenopathy present.       Left: No supraclavicular adenopathy present.  Neurological: She is alert and oriented to person, place, and time. She has normal strength. No sensory deficit. GCS eye subscore is 4. GCS verbal subscore is 5. GCS motor subscore is 6.  Skin: Skin is warm and dry. No rash noted. She is  not diaphoretic.  Psychiatric: She has a normal mood and affect.   Filed Vitals:   06/24/13 1424  BP: 98/78  Pulse: 72  Temp: 98.9 F (37.2 C)  Resp: 16   Lab Results  Component Value Date   WBC 4.9 04/15/2012   HGB 12.4 04/15/2012   HCT 37.2 04/15/2012   PLT 294.0 04/15/2012   GLUCOSE 91 04/15/2012   CHOL 131 04/15/2012   TRIG 46.0 04/15/2012   HDL 56.90 04/15/2012    LDLCALC 65 04/15/2012   ALT 16 12/24/2007   AST 23 12/24/2007   NA 138 04/15/2012   K 4.1 04/15/2012   CL 105 04/15/2012   CREATININE 0.9 04/15/2012   BUN 9 04/15/2012   CO2 27 04/15/2012   TSH 0.98 04/15/2012   HGBA1C 5.7 03/02/2013   Results for orders placed in visit on 06/24/13  POCT RAPID STREP A (OFFICE)      Result Value Ref Range   Rapid Strep A Screen Negative  Negative       Assessment & Plan:   Pharyngitis: Strep negative Symptomatic treatment of salt water gargles, Ibuprofen and throat lozenges.  Myalgia/paresthesia Symptoms are ongoing and intermittent. Could possibly be related to thyroid or an anemia. Will order labs for further evaluation.

## 2013-06-25 LAB — VITAMIN B12: VITAMIN B 12: 431 pg/mL (ref 211–911)

## 2013-06-25 LAB — TSH: TSH: 1.02 u[IU]/mL (ref 0.35–5.50)

## 2013-06-27 ENCOUNTER — Encounter: Payer: Self-pay | Admitting: Physician Assistant

## 2013-07-23 ENCOUNTER — Ambulatory Visit: Payer: 59 | Admitting: Physician Assistant

## 2013-07-31 ENCOUNTER — Encounter (HOSPITAL_COMMUNITY): Payer: Self-pay | Admitting: Emergency Medicine

## 2013-07-31 ENCOUNTER — Emergency Department (INDEPENDENT_AMBULATORY_CARE_PROVIDER_SITE_OTHER)
Admission: EM | Admit: 2013-07-31 | Discharge: 2013-07-31 | Disposition: A | Discharge status: Home / Self Care | Payer: 59 | Source: Home / Self Care | Attending: Family Medicine | Admitting: Family Medicine

## 2013-07-31 DIAGNOSIS — J329 Chronic sinusitis, unspecified: Secondary | ICD-10-CM

## 2013-07-31 MED ORDER — MINOCYCLINE HCL 100 MG PO CAPS: 100.0000 mg | ORAL_CAPSULE | Freq: Two times a day (BID) | ORAL | Status: DC

## 2013-07-31 NOTE — ED Provider Notes (Signed)
CSN: [id number]     Arrival date & time 07/31/13  0809 History   First MD Initiated Contact with Patient 07/31/13 0827     Chief Complaint  Patient presents with  . URI   (Consider location/radiation/quality/duration/timing/severity/associated sxs/prior Treatment) Patient is a 28 y.o. female presenting with URI. The history is provided by the patient.  URI Presenting symptoms: congestion, cough, facial pain and rhinorrhea   Presenting symptoms: no fever   Severity:  Moderate Onset quality:  Gradual Duration:  2 weeks Chronicity:  New Ineffective treatments: using zyrtec and flonase for 2 weeks, causing nosebleeds., sx come and go. Associated symptoms: sinus pain   Risk factors: sick contacts     Past Medical History  Diagnosis Date  . Ovarian cyst   . Pelvic pain   . HSV-1 (herpes simplex virus 1) infection   . HSV-2 (herpes simplex virus 2) infection   . Abnormal Pap smear   . Chlamydia   . Ventricular tachycardia   . Migraine   . GERD (gastroesophageal reflux disease)    Past Surgical History  Procedure Laterality Date  . Colposcopy  2008    has had procedure done couple times   Family History  Problem Relation Age of Onset  . Birth defects Sister   . Hypertension Maternal Grandmother   . Stroke Maternal Grandmother   . Diabetes Maternal Grandfather   . Diabetes Paternal Grandmother   . Hypertension Mother    History  Substance Use Topics  . Smoking status: Current Some Day Smoker    Types: Cigars  . Smokeless tobacco: Never Used     Comment: 1 cigarillo/day  . Alcohol Use: 4.2 oz/week    7 Glasses of wine per week     Comment: occ wine   OB History   Grav Para Term Preterm Abortions TAB SAB Ect Mult Living   3 1 1  0 2 1 1  0 0 1     Review of Systems  Constitutional: Negative.  Negative for fever.  HENT: Positive for congestion, nosebleeds, postnasal drip, rhinorrhea and sinus pressure.   Respiratory: Positive for cough.   Cardiovascular: Negative.      Allergies  Rondec  Home Medications   Current Outpatient Rx  Name  Route  Sig  Dispense  Refill  . cetirizine (ZYRTEC) 10 MG tablet   Oral   Take 10 mg by mouth daily as needed.         . Etonogestrel (NEXPLANON [place])   Subcutaneous   Inject into the skin.         . minocycline (MINOCIN,DYNACIN) 100 MG capsule   Oral   Take 1 capsule (100 mg total) by mouth 2 (two) times daily.   20 capsule   0   . traMADol (ULTRAM) 50 MG tablet   Oral   Take 1 tablet (50 mg total) by mouth every 6 (six) hours as needed.   30 tablet   0    BP 116/63  Pulse 80  Temp(Src) 98 F (36.7 C) (Oral)  Resp 16  SpO2 100% Physical Exam  ED Course  Procedures (including critical care time) Labs Review Labs Reviewed - No data to display Imaging Review No results found.   MDM   1. Sinusitis        Linna HoffJames D Stephane Niemann, MD 07/31/13 [phone]

## 2013-07-31 NOTE — Discharge Instructions (Signed)
Drink plenty of fluids as discussed, use medicine as prescribed, and mucinex or delsym for cough. Return or see your doctor if further problems °

## 2013-07-31 NOTE — ED Notes (Signed)
Pt  Reports    Symptoms  Of  Cough  Congested         sorethroat  And  Body  Aches          X  2  Weeks              With  Sinus  heaDACHE  AS  WELL

## 2013-10-08 ENCOUNTER — Encounter: Payer: Self-pay | Admitting: Internal Medicine

## 2013-10-08 ENCOUNTER — Ambulatory Visit (INDEPENDENT_AMBULATORY_CARE_PROVIDER_SITE_OTHER): Payer: 59 | Admitting: Internal Medicine

## 2013-10-08 VITALS — BP 110/80 | HR 91 | Temp 99.4°F | Resp 13 | Wt 224.4 lb

## 2013-10-08 DIAGNOSIS — M549 Dorsalgia, unspecified: Secondary | ICD-10-CM

## 2013-10-08 DIAGNOSIS — B9789 Other viral agents as the cause of diseases classified elsewhere: Secondary | ICD-10-CM

## 2013-10-08 DIAGNOSIS — J028 Acute pharyngitis due to other specified organisms: Principal | ICD-10-CM

## 2013-10-08 DIAGNOSIS — R609 Edema, unspecified: Secondary | ICD-10-CM

## 2013-10-08 DIAGNOSIS — J029 Acute pharyngitis, unspecified: Secondary | ICD-10-CM

## 2013-10-08 DIAGNOSIS — G8929 Other chronic pain: Secondary | ICD-10-CM

## 2013-10-08 MED ORDER — TRAMADOL HCL 50 MG PO TABS: 50.0000 mg | ORAL_TABLET | Freq: Four times a day (QID) | ORAL | Status: DC | PRN

## 2013-10-08 NOTE — Patient Instructions (Signed)
Zicam Melts or Zinc lozenges as per package label for scratchy throat . Complementary options include  vitamin C 2000 mg daily; & Echinacea for 4-7 days. Report persistent or progressive fever; discolored nasal or chest secretions; or frontal headache or facial  pain.     Please consider a water pic to gavage out the impactions in the tonsils. These impactions cause inflammation and possible secondary infection of the tonsil.   The best exercises for the back include freestyle swimming, stretch aerobics, and yoga.Cybex & Nautilus machines rather than dead weights are better for the back.

## 2013-10-08 NOTE — Progress Notes (Signed)
Subjective:    Patient ID: Elizabeth Stevenson, female    DOB: 1986/02/12, 28 y.o.   MRN: [id number]  HPI  Her symptoms began yesterday as a mild sore throat which is unchanged. This was worse on R side than left.There's been no associated fever but she has had fatigue, chills& decreased appetite. No fever reported. Prior to the onset of sore throat she did have three-day history of loose stools without frank diarrhea. She also describes exacerbation of chronic back pain which is been an issue for several years. Last night the pain involved the entire spine was so severe ( 10 on 10 scale) she could not get out of bed. Pain was described as dull and aching. She took no medicine for the pain; but this morning the pain had resolved with mobilization.      Review of Systems She denies head or chest congestion, cough, otic pain, otic discharge, shortness of breath, or wheezing. She describes intermittent swelling of her feet which is present upon awakening. There is no specific pattern for this occurrence. This does resolve as she is up and active She also describes numbness and tingling her hands. She has had no weakness in the limbs or incontinence of urine or stool.       Objective:   Physical Exam Derm exam reveals diffuse multiple tattoos. There is some central weight gain. There are punctate tonsillar increased since on the right without exudate. She has no tender cervical lymphadenopathy or axillary LA. Gen.:  well-nourished in appearance. Alert and cooperative throughout exam. Head: Normocephalic without obvious abnormalities Eyes: No corneal or conjunctival inflammation noted. Pupils equal round reactive to light and accommodation. Extraocular motion intact.  Ears: External  ear exam reveals no significant lesions or deformities. Canals clear .TMs normal. Hearing is grossly normal bilaterally. Nose: External nasal exam reveals no deformity or inflammation. Nasal mucosa are pink  and moist. No lesions or exudates noted.   Mouth: Teeth in good repair. Neck: No deformities, masses, or tenderness noted. Range of motion & Thyroid normal Lungs: Normal respiratory effort; chest expands symmetrically. Lungs are clear to auscultation without rales, wheezes, or increased work of breathing. Heart: Normal rate and rhythm. Normal S1 and S2. No gallop, click, or rub. No murmur. Abdomen: Bowel sounds normal; abdomen soft and nontender. No masses, organomegaly or hernias noted.          Musculoskeletal/extremities: No deformity or scoliosis noted of  the thoracic or lumbar spine. No clubbing, cyanosis, or significant extremity  deformity noted. Range of motion normal .Tone & strength normal. Hand joints normal  Fingernail health good. No pedal edema appreciated. Able to lie down & sit up w/o help. Negative SLR bilaterally Vascular: Carotid, radial artery, dorsalis pedis and  posterior tibial pulses are full and equal. No bruits present. Neurologic: Alert and oriented x3. Deep tendon reflexes symmetrical and normal.  Gait normal  including heel & toe walking .  Skin: Intact without suspicious lesions or rashes. Psych: Slightly anxious mood and affect. Normally interactive  Assessment & Plan:  #1 sore throat in the context of tonsillar inclusions. Viral etiology suggested by her recent loose stool. She lacks tender cervical lymphadenopathy.  2 chronic back pain with exacerbation 5/27-28. This is now resolved  #3 edema, not documented  See after visit summary.

## 2013-10-08 NOTE — Progress Notes (Signed)
   Subjective:    Patient ID: Elizabeth Stevenson, female    DOB: Jul 28, 1985, 28 y.o.   MRN: [id number]  HPI Sore throat began yesterday.  The pain is mild and unchanged. Pain is worse on the R side.  There has been no fever that the pt has noticed, though she does report chills, decreased appetite and fatigue.  She reports a 3 day history of loose stools that has resolved. She has taken no medications to alleviate her symptoms.  ROS: Denies nasal or chest congestion, coughing, ear discharge, ear pain, hoarse voice or shortness of breath or wheezing.  HPI:  Pt complains of chronic back pain that has been going on for several years. Per pt, she used to see Dr. Luz Brazen for her back pain. Last night the patient had an acute episode of back pain, different from her chronic pain.  The pain started in her upper back medially, and radiated down to her lower back. She described it as "all over pain".  The pain lasted all night and kept the patient from sleeping. The pain was described as a dull, ache and reported as a 10/10. She did not take any medication for the pain butreports that this morning the pain subsided after she started walking around. She is currently back at her baseline.  ROS: Denies loss of bowel or bladder. Reports numbness and tingling intermittently in her hands. She has a history of migraines, but does not report having one last night.     HPI: Pt reports pedal edema. The edema occurs in the morning upon awakening and goes away with walking. The pt states this swelling happens randomly, the last time being a month ago.  ROS: Denies tingling or numbness in her feet, shortness of breath, pain in her calves.     Review of Systems Constitutional: Positive for chills, appetite change and fatigue. Negative for fever.   HENT: Positive for tooth pain. Negative for congestion, ear discharge, ear pain, hoarse voice, postnasal drip, rhinorrhea and sinus pressure.   Eyes: Negative for itching.    Respiratory: Negative for cough and shortness of breath.   Gastrointestinal: Positive for diarrhea. Loose stool x 3 days. Skin: Negative for rash.    Objective:   Physical Exam        Assessment & Plan:

## 2013-10-08 NOTE — Progress Notes (Signed)
Pre visit review using our clinic review tool, if applicable. No additional management support is needed unless otherwise documented below in the visit note. 

## 2014-01-21 ENCOUNTER — Encounter: Payer: Self-pay | Admitting: Internal Medicine

## 2014-01-21 ENCOUNTER — Ambulatory Visit (INDEPENDENT_AMBULATORY_CARE_PROVIDER_SITE_OTHER): Payer: 59 | Admitting: Internal Medicine

## 2014-01-21 VITALS — BP 124/84 | HR 80 | Temp 98.5°F | Resp 18 | Wt 224.0 lb

## 2014-01-21 DIAGNOSIS — L989 Disorder of the skin and subcutaneous tissue, unspecified: Secondary | ICD-10-CM

## 2014-01-21 DIAGNOSIS — M7662 Achilles tendinitis, left leg: Secondary | ICD-10-CM

## 2014-01-21 DIAGNOSIS — M7660 Achilles tendinitis, unspecified leg: Secondary | ICD-10-CM

## 2014-01-21 NOTE — Progress Notes (Signed)
Pre visit review using our clinic review tool, if applicable. No additional management support is needed unless otherwise documented below in the visit note. 

## 2014-01-21 NOTE — Assessment & Plan Note (Signed)
Small knot on the left leg with some darkening of the skin above. Not concerning for malignancy or worrisome. Will follow at next visit.

## 2014-01-21 NOTE — Patient Instructions (Signed)
Work on [employer]. The knot on your leg is fine and we will watch it at your next visit.   Come back when it is convenient to talk about some maintenance things that you want to discuss.   Plantar Fasciitis (Heel Spur Syndrome) with Rehab The plantar fascia is a fibrous, ligament-like, soft-tissue structure that spans the bottom of the foot. Plantar fasciitis is a condition that causes pain in the foot due to inflammation of the tissue. SYMPTOMS   Pain and tenderness on the underneath side of the foot.  Pain that worsens with standing or walking. CAUSES  Plantar fasciitis is caused by irritation and injury to the plantar fascia on the underneath side of the foot. Common mechanisms of injury include:  Direct trauma to bottom of the foot.  Damage to a small nerve that runs under the foot where the main fascia attaches to the heel bone.  Stress placed on the plantar fascia due to bone spurs. RISK INCREASES WITH:   Activities that place stress on the plantar fascia (running, jumping, pivoting, or cutting).  Poor strength and flexibility.  Improperly fitted shoes.  Tight calf muscles.  Flat feet.  Failure to warm-up properly before activity.  Obesity. PREVENTION  Warm up and stretch properly before activity.  Allow for adequate recovery between workouts.  Maintain physical fitness:  Strength, flexibility, and endurance.  Cardiovascular fitness.  Maintain a health body weight.  Avoid stress on the plantar fascia.  Wear properly fitted shoes, including arch supports for individuals who have flat feet. PROGNOSIS  If treated properly, then the symptoms of plantar fasciitis usually resolve without surgery. However, occasionally surgery is necessary. RELATED COMPLICATIONS   Recurrent symptoms that may result in a chronic condition.  Problems of the lower back that are caused by compensating for the injury, such as limping.  Pain or weakness of the foot  during push-off following surgery.  Chronic inflammation, scarring, and partial or complete fascia tear, occurring more often from repeated injections. TREATMENT  Treatment initially involves the use of ice and medication to help reduce pain and inflammation. The use of strengthening and stretching exercises may help reduce pain with activity, especially stretches of the Achilles tendon. These exercises may be performed at home or with a therapist. Your caregiver may recommend that you use heel cups of arch supports to help reduce stress on the plantar fascia. Occasionally, corticosteroid injections are given to reduce inflammation. If symptoms persist for greater than 6 months despite non-surgical (conservative), then surgery may be recommended.  MEDICATION   If pain medication is necessary, then nonsteroidal anti-inflammatory medications, such as aspirin and ibuprofen, or other minor pain relievers, such as acetaminophen, are often recommended.  Do not take pain medication within 7 days before surgery.  Prescription pain relievers may be given if deemed necessary by your caregiver. Use only as directed and only as much as you need.  Corticosteroid injections may be given by your caregiver. These injections should be reserved for the most serious cases, because they may only be given a certain number of times. HEAT AND COLD  Cold treatment (icing) relieves pain and reduces inflammation. Cold treatment should be applied for 10 to 15 minutes every 2 to 3 hours for inflammation and pain and immediately after any activity that aggravates your symptoms. Use ice packs or massage the area with a piece of ice (ice massage).  Heat treatment may be used prior to performing the stretching and strengthening activities prescribed by your  caregiver, physical therapist, or athletic trainer. Use a heat pack or soak the injury in warm water. SEEK IMMEDIATE MEDICAL CARE IF:  Treatment seems to offer no benefit,  or the condition worsens.  Any medications produce adverse side effects. EXERCISES RANGE OF MOTION (ROM) AND STRETCHING EXERCISES - Plantar Fasciitis (Heel Spur Syndrome) These exercises may help you when beginning to rehabilitate your injury. Your symptoms may resolve with or without further involvement from your physician, physical therapist or athletic trainer. While completing these exercises, remember:   Restoring tissue flexibility helps normal motion to return to the joints. This allows healthier, less painful movement and activity.  An effective stretch should be held for at least 30 seconds.  A stretch should never be painful. You should only feel a gentle lengthening or release in the stretched tissue. RANGE OF MOTION - Toe Extension, Flexion  Sit with your right / left leg crossed over your opposite knee.  Grasp your toes and gently pull them back toward the top of your foot. You should feel a stretch on the bottom of your toes and/or foot.  Hold this stretch for __________ seconds.  Now, gently pull your toes toward the bottom of your foot. You should feel a stretch on the top of your toes and or foot.  Hold this stretch for __________ seconds. Repeat __________ times. Complete this stretch __________ times per day.  RANGE OF MOTION - Ankle Dorsiflexion, Active Assisted  Remove shoes and sit on a chair that is preferably not on a carpeted surface.  Place right / left foot under knee. Extend your opposite leg for support.  Keeping your heel down, slide your right / left foot back toward the chair until you feel a stretch at your ankle or calf. If you do not feel a stretch, slide your bottom forward to the edge of the chair, while still keeping your heel down.  Hold this stretch for __________ seconds. Repeat __________ times. Complete this stretch __________ times per day.  STRETCH - Gastroc, Standing  Place hands on wall.  Extend right / left leg, keeping the front  knee somewhat bent.  Slightly point your toes inward on your back foot.  Keeping your right / left heel on the floor and your knee straight, shift your weight toward the wall, not allowing your back to arch.  You should feel a gentle stretch in the right / left calf. Hold this position for __________ seconds. Repeat __________ times. Complete this stretch __________ times per day. STRETCH - Soleus, Standing  Place hands on wall.  Extend right / left leg, keeping the other knee somewhat bent.  Slightly point your toes inward on your back foot.  Keep your right / left heel on the floor, bend your back knee, and slightly shift your weight over the back leg so that you feel a gentle stretch deep in your back calf.  Hold this position for __________ seconds. Repeat __________ times. Complete this stretch __________ times per day. STRETCH - Gastrocsoleus, Standing  Note: This exercise can place a lot of stress on your foot and ankle. Please complete this exercise only if specifically instructed by your caregiver.   Place the ball of your right / left foot on a step, keeping your other foot firmly on the same step.  Hold on to the wall or a rail for balance.  Slowly lift your other foot, allowing your body weight to press your heel down over the edge of the step.  You should feel a stretch in your right / left calf.  Hold this position for __________ seconds.  Repeat this exercise with a slight bend in your right / left knee. Repeat __________ times. Complete this stretch __________ times per day.  STRENGTHENING EXERCISES - Plantar Fasciitis (Heel Spur Syndrome)  These exercises may help you when beginning to rehabilitate your injury. They may resolve your symptoms with or without further involvement from your physician, physical therapist or athletic trainer. While completing these exercises, remember:   Muscles can gain both the endurance and the strength needed for everyday  activities through controlled exercises.  Complete these exercises as instructed by your physician, physical therapist or athletic trainer. Progress the resistance and repetitions only as guided. STRENGTH - Towel Curls  Sit in a chair positioned on a non-carpeted surface.  Place your foot on a towel, keeping your heel on the floor.  Pull the towel toward your heel by only curling your toes. Keep your heel on the floor.  If instructed by your physician, physical therapist or athletic trainer, add ____________________ at the end of the towel. Repeat __________ times. Complete this exercise __________ times per day. STRENGTH - Ankle Inversion  Secure one end of a rubber exercise band/tubing to a fixed object (table, pole). Loop the other end around your foot just before your toes.  Place your fists between your knees. This will focus your strengthening at your ankle.  Slowly, pull your big toe up and in, making sure the band/tubing is positioned to resist the entire motion.  Hold this position for __________ seconds.  Have your muscles resist the band/tubing as it slowly pulls your foot back to the starting position. Repeat __________ times. Complete this exercises __________ times per day.  Document Released: 04/30/2005 Document Revised: 07/23/2011 Document Reviewed: 08/12/2008 [facility] Patient Information 2015 [place], [place]. This information is not intended to replace advice given to you by your health care provider. Make sure you discuss any questions you have with your health care provider.

## 2014-01-21 NOTE — Assessment & Plan Note (Signed)
Resolved at this time. Advised continued stretching to prevent re-injury. Tenderness on foot advised stretching given new level of activity and possible overuse.

## 2014-01-21 NOTE — Progress Notes (Signed)
   Subjective:    Patient ID: Elizabeth Stevenson, female    DOB: Oct 03, 1985, 28 y.o.   MRN: [id number]  HPI The patient is a 28 YO female coming in today to address a knot on her leg that she is concerned about. She does have limited PMH which is non-contributory. She recently started exercising more and is now doing zoomba, eliptical, and exercise machines. She noticed it after that and has been having occasional cramps in her leg. She does get pains in her feet after walking up flights of stairs. She denies chest pains, SOB. No other complaints at today's visit.   Review of Systems  Constitutional: Positive for activity change. Negative for appetite change and fatigue.       Exercising more  Respiratory: Negative for cough, chest tightness and shortness of breath.   Cardiovascular: Negative for chest pain, palpitations and leg swelling.  Musculoskeletal: Positive for myalgias. Negative for back pain and gait problem.       Objective:   Physical Exam  Constitutional: She appears well-developed and well-nourished.  Overweight  HENT:  Head: Normocephalic and atraumatic.  Eyes: EOM are normal.  Cardiovascular: Normal rate and regular rhythm.   Pulmonary/Chest: Effort normal and breath sounds normal. No respiratory distress. She has no wheezes. She has no rales.  Abdominal: Soft. Bowel sounds are normal. She exhibits no distension. There is no tenderness. There is no rebound.  Musculoskeletal: She exhibits no edema.  Some tenderness along the bottom of the foot   Skin: Skin is warm and dry.  Small knot along the tibia mid shin without tenderness or edema.    Filed Vitals:   01/21/14 1059  BP: 124/84  Pulse: 80  Temp: 98.5 F (36.9 C)  TempSrc: Oral  Resp: 18  Weight: 224 lb (101.606 kg)  SpO2: 98%      Assessment & Plan:

## 2014-01-25 ENCOUNTER — Ambulatory Visit: Payer: 59 | Admitting: Internal Medicine

## 2014-02-19 ENCOUNTER — Ambulatory Visit (INDEPENDENT_AMBULATORY_CARE_PROVIDER_SITE_OTHER): Payer: 59 | Admitting: Internal Medicine

## 2014-02-19 ENCOUNTER — Encounter: Payer: Self-pay | Admitting: Internal Medicine

## 2014-02-19 VITALS — BP 110/70 | HR 59 | Temp 98.7°F | Resp 14 | Ht 64.5 in | Wt 227.0 lb

## 2014-02-19 DIAGNOSIS — G43109 Migraine with aura, not intractable, without status migrainosus: Secondary | ICD-10-CM

## 2014-02-19 DIAGNOSIS — E669 Obesity, unspecified: Secondary | ICD-10-CM

## 2014-02-19 MED ORDER — DICLOFENAC SODIUM 1 % TD GEL: 2.0000 g | Freq: Three times a day (TID) | TRANSDERMAL | Status: DC | PRN

## 2014-02-19 MED ORDER — SUMATRIPTAN 5 MG/ACT NA SOLN: 1.0000 | NASAL | Status: DC | PRN

## 2014-02-19 NOTE — Patient Instructions (Addendum)
We have sent in a nose spray for your migraines that you can use for them. It is the same medicine as imitrex but has less side effects.   The gel that we have sent in is called voltaren gel that you can rub on your back for pain up to 3 times per day.  Come back in about 1 year for a check up.   Migraine Headache A migraine headache is an intense, throbbing pain on one or both sides of your head. A migraine can last for 30 minutes to several hours. CAUSES  The exact cause of a migraine headache is not always known. However, a migraine may be caused when nerves in the brain become irritated and release chemicals that cause inflammation. This causes pain. Certain things may also trigger migraines, such as:  Alcohol.  Smoking.  Stress.  Menstruation.  Aged cheeses.  Foods or drinks that contain nitrates, glutamate, aspartame, or tyramine.  Lack of sleep.  Chocolate.  Caffeine.  Hunger.  Physical exertion.  Fatigue.  Medicines used to treat chest pain (nitroglycerine), birth control pills, estrogen, and some blood pressure medicines. SIGNS AND SYMPTOMS  Pain on one or both sides of your head.  Pulsating or throbbing pain.  Severe pain that prevents daily activities.  Pain that is aggravated by any physical activity.  Nausea, vomiting, or both.  Dizziness.  Pain with exposure to bright lights, loud noises, or activity.  General sensitivity to bright lights, loud noises, or smells. Before you get a migraine, you may get warning signs that a migraine is coming (aura). An aura may include:  Seeing flashing lights.  Seeing bright spots, halos, or zigzag lines.  Having tunnel vision or blurred vision.  Having feelings of numbness or tingling.  Having trouble talking.  Having muscle weakness. DIAGNOSIS  A migraine headache is often diagnosed based on:  Symptoms.  Physical exam.  A CT scan or MRI of your head. These imaging tests cannot diagnose  migraines, but they can help rule out other causes of headaches. TREATMENT Medicines may be given for pain and nausea. Medicines can also be given to help prevent recurrent migraines.  HOME CARE INSTRUCTIONS  Only take over-the-counter or prescription medicines for pain or discomfort as directed by your health care provider. The use of long-term narcotics is not recommended.  Lie down in a dark, quiet room when you have a migraine.  Keep a journal to find out what may trigger your migraine headaches. For example, write down:  What you eat and drink.  How much sleep you get.  Any change to your diet or medicines.  Limit alcohol consumption.  Quit smoking if you smoke.  Get 7-9 hours of sleep, or as recommended by your health care provider.  Limit stress.  Keep lights dim if bright lights bother you and make your migraines worse. SEEK IMMEDIATE MEDICAL CARE IF:   Your migraine becomes severe.  You have a fever.  You have a stiff neck.  You have vision loss.  You have muscular weakness or loss of muscle control.  You start losing your balance or have trouble walking.  You feel faint or pass out.  You have severe symptoms that are different from your first symptoms. MAKE SURE YOU:   Understand these instructions.  Will watch your condition.  Will get help right away if you are not doing well or get worse. Document Released: 04/30/2005 Document Revised: 09/14/2013 Document Reviewed: 01/05/2013 [facility] Patient Information 2015 [place], [place].  This information is not intended to replace advice given to you by your health care provider. Make sure you discuss any questions you have with your health care provider.

## 2014-02-19 NOTE — Assessment & Plan Note (Signed)
Given sumatriptan nasal spray to try for migraines. Talked to her about migraine ppx but she was not interested in taking a medicine everyday. Has frequent (>5 per month) headaches.

## 2014-02-19 NOTE — Assessment & Plan Note (Signed)
She is trying to lose weight with diet and exercise and will continue.

## 2014-02-19 NOTE — Progress Notes (Signed)
   Subjective:    Patient ID: Elizabeth Stevenson, female    DOB: May 25, 1985, 28 y.o.   MRN: [id number]  HPI The patient is a 28 YO female who is coming in today to establish care. She has PMH of migraines and some back pain. She is doing okay with the back pain and does not like to take medicines. She has been trying to be more active and eat better and thinks this is helping with the pain. She denies other complaints today.  Review of Systems  Constitutional: Positive for activity change. Negative for appetite change and fatigue.       Exercising more  Eyes: Negative.   Respiratory: Negative for cough, chest tightness and shortness of breath.   Cardiovascular: Negative for chest pain, palpitations and leg swelling.  Gastrointestinal: Negative for abdominal pain, diarrhea, constipation and abdominal distention.  Musculoskeletal: Positive for back pain. Negative for arthralgias, gait problem and myalgias.  Skin: Negative.   Neurological: Positive for headaches. Negative for dizziness, weakness, light-headedness and numbness.      Objective:   Physical Exam  Constitutional: She appears well-developed and well-nourished.  Overweight  HENT:  Head: Normocephalic and atraumatic.  Eyes: EOM are normal.  Cardiovascular: Normal rate and regular rhythm.   Pulmonary/Chest: Effort normal and breath sounds normal. No respiratory distress. She has no wheezes. She has no rales.  Abdominal: Soft. Bowel sounds are normal. She exhibits no distension. There is no tenderness. There is no rebound.  Musculoskeletal: She exhibits no edema.  Skin: Skin is warm and dry.  Knot along the tibia mid shin without tenderness or edema, smaller than prior.    Filed Vitals:   02/19/14 1007  BP: 110/70  Pulse: 59  Temp: 98.7 F (37.1 C)  TempSrc: Oral  Resp: 14  Height: 5' 4.5" (1.638 m)  Weight: 227 lb (102.967 kg)  SpO2: 98%      Assessment & Plan:  Patient declines flu shot.

## 2014-02-19 NOTE — Progress Notes (Signed)
Pre visit review using our clinic review tool, if applicable. No additional management support is needed unless otherwise documented below in the visit note. 

## 2014-03-08 ENCOUNTER — Other Ambulatory Visit: Payer: Self-pay | Admitting: Internal Medicine

## 2014-03-15 ENCOUNTER — Encounter: Payer: Self-pay | Admitting: Internal Medicine

## 2014-07-09 ENCOUNTER — Ambulatory Visit (INDEPENDENT_AMBULATORY_CARE_PROVIDER_SITE_OTHER): Payer: 59 | Admitting: Internal Medicine

## 2014-07-09 ENCOUNTER — Encounter: Payer: Self-pay | Admitting: Internal Medicine

## 2014-07-09 VITALS — BP 110/72 | HR 73 | Temp 98.4°F | Wt 233.0 lb

## 2014-07-09 DIAGNOSIS — J309 Allergic rhinitis, unspecified: Secondary | ICD-10-CM

## 2014-07-09 MED ORDER — FLUTICASONE PROPIONATE 50 MCG/ACT NA SUSP: 2.0000 | Freq: Every day | NASAL | Status: DC

## 2014-07-09 NOTE — Patient Instructions (Signed)
We have sent in a medicine called flonase. It will help to dry up the sinuses and hopefully help to ease the feeling in the ears. Use 2 puffs in each nostril once a day for the next 2 weeks.   If you are not feeling better in about 2 weeks call us and we would be happy to have you see the ear specialist.   You can try a humidifier in your room at night with the fan.

## 2014-07-09 NOTE — Progress Notes (Signed)
Pre visit review using our clinic review tool, if applicable. No additional management support is needed unless otherwise documented below in the visit note. 

## 2014-07-10 NOTE — Progress Notes (Signed)
   Subjective:    Patient ID: Elizabeth Stevenson, female    DOB: 05-03-1986, [age] y.o.   MRN: [id number]  HPI The patient is a 29 YO female who is coming in for ear popping. She has had the sensation for about 3 months. It has been worse in the last week or so. She describes it as pressure which is uncomfortable and can impede her hearing. She has not tried anything for it. During the same time course she has had some sinus congestion and pressure as well.   Review of Systems  Constitutional: Positive for activity change. Negative for appetite change and fatigue.       Exercising more  HENT: Positive for congestion, ear pain, rhinorrhea and sinus pressure. Negative for ear discharge, facial swelling and hearing loss.   Eyes: Negative.   Respiratory: Negative for cough, chest tightness and shortness of breath.   Cardiovascular: Negative for chest pain, palpitations and leg swelling.  Gastrointestinal: Negative for abdominal pain, diarrhea, constipation and abdominal distention.  Skin: Negative.   Neurological: Negative for dizziness, weakness, light-headedness and numbness.      Objective:   Physical Exam  Constitutional: She appears well-developed and well-nourished.  HENT:  Head: Normocephalic and atraumatic.  Oropharynx with slight erythema, nostrils with red swollen turbinates  Eyes: EOM are normal.  Neck: Normal range of motion.  Cardiovascular: Normal rate and regular rhythm.   Pulmonary/Chest: Effort normal and breath sounds normal. No respiratory distress. She has no wheezes. She has no rales.  Abdominal: Soft. Bowel sounds are normal.   Filed Vitals:   07/09/14 1626  BP: 110/72  Pulse: 73  Temp: 98.4 F (36.9 C)  TempSrc: Oral  Weight: 233 lb (105.688 kg)  SpO2: 98%      Assessment & Plan:

## 2014-07-10 NOTE — Assessment & Plan Note (Signed)
Try flonase for 2 weeks to see the sinus inflammation is causing the ear symptoms. If no relief, refer to ENT for hearing evaluation.

## 2014-08-26 ENCOUNTER — Encounter: Payer: Self-pay | Admitting: Internal Medicine

## 2014-08-26 ENCOUNTER — Ambulatory Visit (INDEPENDENT_AMBULATORY_CARE_PROVIDER_SITE_OTHER): Payer: 59 | Admitting: Internal Medicine

## 2014-08-26 VITALS — BP 114/72 | HR 89 | Temp 98.4°F | Resp 12 | Ht 64.0 in | Wt 234.0 lb

## 2014-08-26 DIAGNOSIS — R208 Other disturbances of skin sensation: Secondary | ICD-10-CM

## 2014-08-26 DIAGNOSIS — R200 Anesthesia of skin: Secondary | ICD-10-CM

## 2014-08-26 DIAGNOSIS — B349 Viral infection, unspecified: Secondary | ICD-10-CM | POA: Diagnosis not present

## 2014-08-26 MED ORDER — DESLORATADINE 5 MG PO TABS: 5.0000 mg | ORAL_TABLET | Freq: Every day | ORAL | Status: DC

## 2014-08-26 NOTE — Patient Instructions (Signed)
We have sent in the allergy alternative called clarinex. Take 1 pill a day and it may help more than your current medicine.   We will send you to the neurologist to check on the nerve symptoms.

## 2014-08-26 NOTE — Progress Notes (Signed)
Pre visit review using our clinic review tool, if applicable. No additional management support is needed unless otherwise documented below in the visit note. 

## 2014-08-28 DIAGNOSIS — R200 Anesthesia of skin: Secondary | ICD-10-CM | POA: Insufficient documentation

## 2014-08-28 DIAGNOSIS — B349 Viral infection, unspecified: Secondary | ICD-10-CM | POA: Insufficient documentation
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
## 2014-08-28 NOTE — Assessment & Plan Note (Signed)
Appears to be resolving. Advised continued conservative measures including ibuprofen for pain, increased sleep, OTC cough medication as needed for additional symptoms. Once fever free for 24 hours without ibuprofen or tylenol cleared to return to work.
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
## 2014-08-31 ENCOUNTER — Other Ambulatory Visit: Payer: Self-pay | Admitting: Obstetrics and Gynecology

## 2014-08-31 DIAGNOSIS — N644 Mastodynia: Secondary | ICD-10-CM

## 2014-09-02 ENCOUNTER — Ambulatory Visit
Admission: RE | Admit: 2014-09-02 | Discharge: 2014-09-02 | Disposition: A | Discharge status: Home / Self Care | Payer: 59 | Source: Ambulatory Visit | Attending: Obstetrics and Gynecology | Admitting: Obstetrics and Gynecology

## 2014-09-02 ENCOUNTER — Other Ambulatory Visit: Payer: 59

## 2014-09-02 DIAGNOSIS — N644 Mastodynia: Secondary | ICD-10-CM

## 2014-10-01 ENCOUNTER — Encounter: Payer: Self-pay | Admitting: Neurology

## 2014-10-01 ENCOUNTER — Ambulatory Visit (INDEPENDENT_AMBULATORY_CARE_PROVIDER_SITE_OTHER): Payer: 59 | Admitting: Neurology

## 2014-10-01 VITALS — BP 112/80 | HR 72 | Ht 64.5 in | Wt 241.0 lb

## 2014-10-01 DIAGNOSIS — G43009 Migraine without aura, not intractable, without status migrainosus: Secondary | ICD-10-CM

## 2014-10-01 DIAGNOSIS — R202 Paresthesia of skin: Secondary | ICD-10-CM

## 2014-10-01 MED ORDER — ELETRIPTAN HYDROBROMIDE 40 MG PO TABS: 40.0000 mg | ORAL_TABLET | ORAL | Status: DC | PRN

## 2014-10-01 NOTE — Progress Notes (Signed)
[facility] Clinic Note - Initial Visit   Date: 10/01/2014   Elizabeth Stevenson MRN: [id number] DOB: 1985/12/11   Dear Dr. Dorise HissKollar:  Thank you for your kind referral of Elizabeth Stevenson for consultation of left hand numbness. Although her history is well known to you, please allow us to reiterate it for the purpose of our medical record. The patient was accompanied to the clinic by self.   History of Present Illness: Elizabeth Stevenson is a 29 y.o. right-handed African American female with migraines and GERD presenting for evaluation of bilateral arm numbness.  Starting around 2013, she noticed bilateral hand numbness and tingling sensation over the left forearm and upper arm.  Symptoms can occur at rest and worse when she is sleeping. She notices is more when she is waking up and is easily able to shake her hands awake within a few minutes.  Symptoms occur about 1-2 times per week. She has difficulty opening tops of bottles only with her right hand.  She endorses neck and pain pain.    She complains of left hand tremors, especially when she is drinking extra caffeine or lifting heavy objects.  This has occurred only a few times.   She also complains of numbness of the feet and swelling which had been ongoing for the past several years, but recently this has not been problematic.     Migraines started in 2011.  Pain is located over the left frontal region, throbbing pain.  Pain lasts all day, occuring about 11 times per month.  She takes Aleve which does not help.  Triggers include certain smells.  She has occasional photophobia, phonophobia, n/v.  She has imitrex but does not like the way it makes her feel.   Out-side paper records, electronic medical record, and images have been reviewed where available and summarized as:  XR cervical spine 04/15/2012: Straightening and reversal of normal cervical lordosis on lateral image. This may be associated with  muscle spasm. No other abnormality is evident.  Lab Results  Component Value Date   TSH 1.02 06/24/2013   Lab Results  Component Value Date   VITAMINB12 431 06/24/2013   Lab Results  Component Value Date   HGBA1C 5.7 03/02/2013     Past Medical History  Diagnosis Date  . Ovarian cyst   . Pelvic pain   . HSV-1 (herpes simplex virus 1) infection   . HSV-2 (herpes simplex virus 2) infection   . Abnormal Pap smear   . Chlamydia   . Ventricular tachycardia   . Migraine   . GERD (gastroesophageal reflux disease)     Past Surgical History  Procedure Laterality Date  . Colposcopy  2008    has had procedure done couple times     Medications:  Current Outpatient Prescriptions on File Prior to Visit  Medication Sig Dispense Refill  . desloratadine (CLARINEX) 5 MG tablet Take 1 tablet (5 mg total) by mouth daily. 30 tablet 6  . Etonogestrel (NEXPLANON [place]) Inject into the skin.    . fluticasone (FLONASE) 50 MCG/ACT nasal spray Place 2 sprays into both nostrils daily. 16 g 6   No current facility-administered medications on file prior to visit.    Allergies:  Allergies  Allergen Reactions  . Rondec [Chlorpheniramine-Pseudoeph] Other (See Comments)    Hallucinations.  Not sure which formulation of rondec (brompheniramine vs. Chlorpheniramine)    Family History: Family History  Problem Relation Age of Onset  . Birth defects Sister   .  Hypertension Maternal Grandmother   . Stroke Maternal Grandmother   . Diabetes Maternal Grandfather   . Diabetes Paternal Grandmother   . Hypertension Mother     Living  . Hyperlipidemia Father   . Hypertension Father   . Healthy Sister   . Healthy Daughter     Social History: History   Social History  . Marital Status: Single    Spouse Name: N/A  . Number of Children: 1  . 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 Smoking status: Former Smoker    Types: Cigars     Quit date: 02/11/2014  . Smokeless tobacco: Never Used  . Alcohol Use: 0.0 oz/week    0 Standard drinks or equivalent per week     Comment: 1 glass of wine, 2-3 times week  . Drug Use: No  . Sexual Activity: Yes    Birth Control/ Protection: Implant   Other Topics Concern  . 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   Highest level of education:  Some college   Caffeine Use-yes   Regular exercise-no    Review of Systems:  CONSTITUTIONAL: No fevers, chills, night sweats, or weight loss.   EYES: No visual changes or eye pain ENT: No hearing changes.  No history of nose bleeds.   RESPIRATORY: No cough, wheezing and shortness of breath.   CARDIOVASCULAR: Negative for chest pain, and palpitations.   GI: Negative for abdominal discomfort, blood in stools or black stools.  No recent change in bowel habits.   GU:  No history of incontinence.   MUSCLOSKELETAL: No history of joint pain or swelling.  No myalgias.   SKIN: Negative for lesions, rash, and itching.   HEMATOLOGY/ONCOLOGY: Negative for prolonged bleeding, bruising easily, and swollen nodes.  No history of cancer.   ENDOCRINE: Negative for cold or heat intolerance, polydipsia or goiter.   PSYCH:  No depression or anxiety symptoms.   NEURO: As Above.   Vital Signs:  BP 112/80 mmHg  Pulse 72  Ht 5' 4.5" (1.638 m)  Wt 241 lb (109.317 kg)  BMI 40.74 kg/m2   General Medical Exam:   General:  Well appearing, comfortable.   Eyes/ENT: see cranial nerve examination.   Neck: No masses appreciated.  Full range of motion without tenderness.  No carotid bruits. Respiratory:  Clear to auscultation, good air entry bilaterally.   Cardiac:  Regular rate and rhythm, no murmur.   Skin:  No rashes or lesions.  Neurological Exam: MENTAL STATUS including orientation to time, place, person, recent and remote memory, attention span and concentration, language, and fund of knowledge is normal.  Speech is not  dysarthric.  CRANIAL NERVES: II:  No visual field defects.  Unremarkable fundi.   III-IV-VI: Pupils equal round and reactive to light.  Normal conjugate, extra-ocular eye movements in all directions of gaze.  No nystagmus.  No ptosis.   V:  Normal facial sensation.     VII:  Normal facial symmetry and movements.  No pathologic facial reflexes.  VIII:  Normal hearing and vestibular function.   IX-X:  Normal palatal movement.   XI:  Normal shoulder shrug and head rotation.   XII:  Normal tongue strength and range of motion, no deviation or fasciculation.  MOTOR:  No atrophy, fasciculations or abnormal movements.  No pronator drift.  Tone is normal.  Right Upper Extremity:    Left Upper Extremity:    Deltoid  5/5   Deltoid  5/5   Biceps  5/5   Biceps  5/5   Triceps  5/5   Triceps  5/5   Wrist extensors  5/5   Wrist extensors  5/5   Wrist flexors  5/5   Wrist flexors  5/5   Finger extensors  5/5   Finger extensors  5/5   Finger flexors  5/5   Finger flexors  5/5   Dorsal interossei  5/5   Dorsal interossei  5/5   Abductor pollicis  5/5   Abductor pollicis  5/5   Tone (Ashworth scale)  0  Tone (Ashworth scale)  0   Right Lower Extremity:    Left Lower Extremity:    Hip flexors  5/5   Hip flexors  5/5   Hip extensors  5/5   Hip extensors  5/5   Knee flexors  5/5   Knee flexors  5/5   Knee extensors  5/5   Knee extensors  5/5   Dorsiflexors  5/5   Dorsiflexors  5/5   Plantarflexors  5/5   Plantarflexors  5/5   Toe extensors  5/5   Toe extensors  5/5   Toe flexors  5/5   Toe flexors  5/5   Tone (Ashworth scale)  0  Tone (Ashworth scale)  0   MSRs:  Right                                                                 Left brachioradialis 2+  brachioradialis 2+  biceps 2+  biceps 2+  triceps 2+  triceps 2+  patellar 2+  patellar 2+  ankle jerk 2+  ankle jerk 2+  Hoffman no  Hoffman no  plantar response down  plantar response down   SENSORY:  Normal and symmetric perception of  light touch, pinprick, vibration, and proprioception.  Romberg's sign absent. Phalen's and Tinel's sign is negative.   COORDINATION/GAIT: Normal finger-to- nose-finger and heel-to-shin.  Intact rapid alternating movements bilaterally.  Able to rise from a chair without using arms.  Gait narrow based and stable. Tandem and stressed gait intact.    IMPRESSION: 1.  Bilateral hand paresthesias, nonspecific with normal exam.  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  She is very interested in getting diagnostic testing, so will proceed with EMG of the upper extremities.  I explained that with mild and sporadic symptoms, diagnostic yield will be low.  She expresses understanding.  2.  Episodic migraine without aura.    -  Stop imitrex due to side effects  -  Start Relpax - samples provided  -  Preventative therapy for headaches declined  3.  Tremors are most likely secondary to exertion or caffeine, reassured patient that there is nothing worrisome  Return to clinic in 3 months.   The duration of this appointment visit was 45 minutes of face-to-face time with the patient.  Greater than 50% of this time was spent in counseling, explanation of diagnosis, planning of further management, and coordination of care.   Thank you for allowing me to participate in patient's care.  If I can answer any additional questions, I would  be pleased to do so.    Sincerely,    Donika K. Posey Pronto, DO

## 2014-10-01 NOTE — Patient Instructions (Addendum)
1.  Stop imitrex 2.  Start Relpax for acute migraine treatment 3.  EMG of the upper extremities 4.  Return to clinic in 3 months

## 2014-11-04 ENCOUNTER — Telehealth: Payer: Self-pay | Admitting: Internal Medicine

## 2014-11-04 ENCOUNTER — Telehealth: Payer: Self-pay | Admitting: *Deleted

## 2014-11-04 NOTE — Telephone Encounter (Signed)
[facility] - Client TELEPHONE ADVICE RECORD [facility] Patient Name: Elizabeth Stevenson Gender: Female DOB: 1986/01/18 Age: 29 Y 2 M 23 D Return Phone Number: [phone] (Primary) Address: City/State/Zip: [place] Client [facility] - Client Client Site  [facility] Physician Genella Mech Contact Type Call Call Type Triage / Clinical Relationship To Patient Self Appointment Disposition EMR Appointment Not Necessary Info pasted into Epic No Return Phone Number [phone] (Primary) Chief Complaint Sore Throat Initial Comment Caller states she has sore throat, nasal drainage, allergies, negative strep, UC says it is viral, but not improving PreDisposition Call Doctor Nurse Assessment Nurse: Apolinar Junes, RN, Darl Pikes Date/Time Lamount Cohen Time): 11/04/2014 10:22:49 AM Confirm and document reason for call. If symptomatic, describe symptoms. ---Caller states she has sore throat, nasal drainage, allergies, negative strep, she was seen at [facility] on Monday doctor says it is viral and that she will probably get a cough to follow which she did - had no ear infection but her tubes are backed up with wax - but not improving much - states the doctor said do antihistamines and nasal sprays and gave her a medication to gargle with she is already on some allergy medications too - states she had some palpations a few times after taking some of these - nothing that continued , but she felt them a few seconds Has the patient traveled out of the country within the last 30 days? ---Not Applicable Does the patient require triage? ---Yes Related visit to physician within the last 2 weeks? ---Yes Does the PT have any chronic conditions? (i.e. diabetes, asthma, etc.) ---No Did the patient indicate they were pregnant? ---No Guidelines Guideline Title Affirmed Question Affirmed Notes Nurse Date/Time (Eastern Time) Sore Throat [1]  Sore throat with cough/cold symptoms AND [2] present < 5 days (all triage questions negative) Apolinar Junes, RN, Darl Pikes 11/04/2014 10:29:57 AM Nasal Allergies (Hay Fever) [1] Nasal allergies AND [2] only certain times of Alver Sorrow 11/04/2014 10:35:10 AM PLEASE NOTE: All timestamps contained within this report are represented as [place] Standard Time. CONFIDENTIALTY NOTICE: This fax transmission is intended only for the addressee. It contains information that is legally privileged, confidential or otherwise protected from use or disclosure. If you are not the intended recipient, you are strictly prohibited from reviewing, disclosing, copying using or disseminating any of this information or taking any action in reliance on or regarding this information. If you have received this fax in error, please notify us immediately by telephone so that we can arrange for its return to [place]. Phone: [phone], Toll-Free: [phone], Fax: [phone] Page: 2 of 3 Call Id: [id number] Guidelines Guideline Title Affirmed Question Affirmed Notes Nurse Date/Time Lamount Cohen Time) year (hay fever) (all triage questions negative) Heart Rate and Heartbeat Questions Palpitations (all triage questions negative) Apolinar Junes, RN, Darl Pikes 11/04/2014 10:38:53 AM Disp. Time Lamount Cohen Time) Disposition Final User 11/04/2014 10:34:40 AM Home Care Alver Sorrow 11/04/2014 10:36:03 AM Home Care Alver Sorrow 11/04/2014 10:40:02 AM Home Care Yes Apolinar Junes, RN, Darl Pikes

## 2014-11-04 NOTE — Telephone Encounter (Signed)
Error

## 2014-11-09 ENCOUNTER — Encounter: Payer: 59 | Admitting: Neurology

## 2014-12-07 ENCOUNTER — Telehealth: Payer: Self-pay | Admitting: *Deleted

## 2014-12-07 NOTE — Telephone Encounter (Signed)
Called patient back and left message for her to call me.   

## 2014-12-07 NOTE — Telephone Encounter (Signed)
Patient called stating that she has been having pressure and numbness on the left side of her head from the temple to the top of her cheek.  She said that it used to happen then stopped but started back last week.  She was wondering if she needs to be seen sooner?  Please advise.

## 2014-12-07 NOTE — Telephone Encounter (Signed)
Patient is coming in Thursday, July 28 at 11:15.

## 2014-12-07 NOTE — Telephone Encounter (Signed)
Please schedule f/u appointment for this week - I have availability Thursday and Friday.

## 2014-12-09 ENCOUNTER — Ambulatory Visit (INDEPENDENT_AMBULATORY_CARE_PROVIDER_SITE_OTHER): Payer: 59 | Admitting: Neurology

## 2014-12-09 ENCOUNTER — Encounter: Payer: Self-pay | Admitting: Neurology

## 2014-12-09 VITALS — BP 108/74 | HR 79 | Ht 64.5 in | Wt 241.4 lb

## 2014-12-09 DIAGNOSIS — G43109 Migraine with aura, not intractable, without status migrainosus: Secondary | ICD-10-CM

## 2014-12-09 DIAGNOSIS — R202 Paresthesia of skin: Secondary | ICD-10-CM | POA: Diagnosis not present

## 2014-12-09 NOTE — Progress Notes (Signed)
Follow-up Visit   Date: 12/09/2014    Elizabeth Stevenson MRN: [id number] DOB: Jan 09, 1986   Interim History: Elizabeth Stevenson is a 29 y.o. right-handed African American female with migraines and GERD returning to the clinic for follow-up of new left head numbness.  The patient was accompanied to the clinic by self.  History of present illness: Starting around 2013, she noticed bilateral hand numbness and tingling sensation over the left forearm and upper arm. Symptoms can occur at rest and worse when she is sleeping. She notices is more when she is waking up and is easily able to shake her hands awake within a few minutes. Symptoms occur about 1-2 times per week. She has difficulty opening tops of bottles only with her right hand. She endorses neck and pain pain.   She complains of left hand tremors, especially when she is drinking extra caffeine or lifting heavy objects. This has occurred only a few times.   She also complains of numbness of the feet and swelling which had been ongoing for the past several years, but recently this has not been problematic.   Migraines started in 2011. Pain is located over the left frontal region, throbbing pain. Pain lasts all day, occuring about 11 times per month. She takes Aleve which does not help. Triggers include certain smells. She has occasional photophobia, phonophobia, n/v. She has imitrex but does not like the way it makes her feel.    UPDATE 12/09/2014:  Last week, she reports having numbness over the upper check and temporal region with pressure over the head.  This was followed by a migraine.  She used Relpax which helped her migraines and she tolerated it well without any side effects.  She has only had to use it three times since May.  She was concerned about the numbness because it has been a long time since she experienced this with a migraine.  There was no associated facial weakness, vision changes, or weakness.     She continues to have bilateral arm numbness but accidentally missed her EMG appointment.    Medications:  Current Outpatient Prescriptions on File Prior to Visit  Medication Sig Dispense Refill  . eletriptan (RELPAX) 40 MG tablet Take 1 tablet (40 mg total) by mouth as needed for migraine or headache. May repeat in 2 hours if headache persists or recurs. 10 tablet 3  . Etonogestrel (NEXPLANON [place]) Inject into the skin.    . fluticasone (FLONASE) 50 MCG/ACT nasal spray Place 2 sprays into both nostrils daily. 16 g 6   No current facility-administered medications on file prior to visit.    Allergies:  Allergies  Allergen Reactions  . Rondec [Chlorpheniramine-Pseudoeph] Other (See Comments)    Hallucinations.  Not sure which formulation of rondec (brompheniramine vs. Chlorpheniramine)    Review of Systems:  CONSTITUTIONAL: No fevers, chills, night sweats, or weight loss.  EYES: No visual changes or eye pain ENT: No hearing changes.  No history of nose bleeds.   RESPIRATORY: No cough, wheezing and shortness of breath.   CARDIOVASCULAR: Negative for chest pain, and palpitations.   GI: Negative for abdominal discomfort, blood in stools or black stools.  No recent change in bowel habits.   GU:  No history of incontinence.   MUSCLOSKELETAL: No history of joint pain or swelling.  No myalgias.   SKIN: Negative for lesions, rash, and itching.   ENDOCRINE: Negative for cold or heat intolerance, polydipsia or goiter.   PSYCH:  No  depression + anxiety symptoms.   NEURO: As Above.   Vital Signs:  BP 108/74 mmHg  Pulse 79  Ht 5' 4.5" (1.638 m)  Wt 241 lb 6 oz (109.487 kg)  BMI 40.81 kg/m2  SpO2 99%   Neurological Exam: MENTAL STATUS including orientation to time, place, person, recent and remote memory, attention span and concentration, language, and fund of knowledge is normal.  Speech is not dysarthric.  CRANIAL NERVES:  Pupils equal round and reactive to light.  Normal  conjugate, extra-ocular eye movements in all directions of gaze.  No ptosis. Normal facial sensation.  Face is symmetric. Palate elevates symmetrically.  Tongue is midline.  MOTOR:  Motor strength is 5/5 in all extremities.  No atrophy, fasciculations or abnormal movements.  No pronator drift.  Tone is normal.    SENSORY:  Intact to vibration and pin prick throughout.  COORDINATION/GAIT: Gait narrow based and stable.    IMPRESSION/PLAN: 1.  Left facial paresthesias are most likely sensory aura with migraines  - Continue treatment with Relpax for episodic migraine  - Reassured patient that there is nothing worrisome on her exam  2. Bilateral hand paresthesias, nonspecific with normal exam. 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 She is very interested in getting diagnostic testing, so will proceed with EMG of the upper extremities.   The duration of this appointment visit was 20 minutes of face-to-face time with the patient.  Greater than 50% of this time was spent in counseling, explanation of diagnosis, planning of further management, and coordination of care.   Thank you for allowing me to participate in patient's care.  If I can answer any additional questions, I would be pleased to do so.    Sincerely,    Donika K. Allena Katz, DO

## 2014-12-09 NOTE — Patient Instructions (Signed)
1.  EMG of bilateral arms 2.  I'll discuss your results at your next visit

## 2014-12-31 ENCOUNTER — Ambulatory Visit: Payer: 59 | Admitting: Neurology

## 2015-01-06 ENCOUNTER — Ambulatory Visit: Payer: 59 | Admitting: Neurology

## 2015-01-18 ENCOUNTER — Encounter: Payer: 59 | Admitting: Neurology

## 2015-01-26 ENCOUNTER — Ambulatory Visit: Payer: 59 | Admitting: Neurology

## 2015-02-21 ENCOUNTER — Encounter: Payer: 59 | Admitting: Internal Medicine

## 2015-02-28 ENCOUNTER — Encounter: Payer: 59 | Admitting: Internal Medicine

## 2015-03-02 ENCOUNTER — Encounter: Payer: 59 | Admitting: Internal Medicine

## 2015-03-23 ENCOUNTER — Ambulatory Visit (INDEPENDENT_AMBULATORY_CARE_PROVIDER_SITE_OTHER): Payer: 59 | Admitting: Internal Medicine

## 2015-03-23 ENCOUNTER — Ambulatory Visit (INDEPENDENT_AMBULATORY_CARE_PROVIDER_SITE_OTHER)
Admission: RE | Admit: 2015-03-23 | Discharge: 2015-03-23 | Disposition: A | Discharge status: Home / Self Care | Payer: 59 | Source: Ambulatory Visit | Attending: Internal Medicine | Admitting: Internal Medicine

## 2015-03-23 ENCOUNTER — Other Ambulatory Visit (INDEPENDENT_AMBULATORY_CARE_PROVIDER_SITE_OTHER): Payer: 59

## 2015-03-23 ENCOUNTER — Encounter: Payer: Self-pay | Admitting: Internal Medicine

## 2015-03-23 VITALS — BP 120/78 | HR 74 | Temp 98.5°F | Resp 12 | Ht 64.0 in | Wt 246.0 lb

## 2015-03-23 DIAGNOSIS — Z0000 Encounter for general adult medical examination without abnormal findings: Secondary | ICD-10-CM

## 2015-03-23 DIAGNOSIS — F17200 Nicotine dependence, unspecified, uncomplicated: Secondary | ICD-10-CM | POA: Diagnosis not present

## 2015-03-23 DIAGNOSIS — M549 Dorsalgia, unspecified: Secondary | ICD-10-CM | POA: Diagnosis not present

## 2015-03-23 DIAGNOSIS — R0989 Other specified symptoms and signs involving the circulatory and respiratory systems: Secondary | ICD-10-CM

## 2015-03-23 DIAGNOSIS — R198 Other specified symptoms and signs involving the digestive system and abdomen: Secondary | ICD-10-CM

## 2015-03-23 DIAGNOSIS — R200 Anesthesia of skin: Secondary | ICD-10-CM | POA: Diagnosis not present

## 2015-03-23 LAB — HEMOGLOBIN A1C: HEMOGLOBIN A1C: 5.6 % (ref 4.6–6.5)

## 2015-03-23 LAB — CBC
HEMATOCRIT: 39.8 % (ref 36.0–46.0)
HEMOGLOBIN: 13.3 g/dL (ref 12.0–15.0)
MCHC: 33.4 g/dL (ref 30.0–36.0)
MCV: 92.3 fl (ref 78.0–100.0)
Platelets: 279 10*3/uL (ref 150.0–400.0)
RBC: 4.3 Mil/uL (ref 3.87–5.11)
RDW: 12.8 % (ref 11.5–15.5)
WBC: 5.2 10*3/uL (ref 4.0–10.5)

## 2015-03-23 LAB — LIPID PANEL
CHOL/HDL RATIO: 3
Cholesterol: 135 mg/dL (ref 0–200)
HDL: 53.1 mg/dL (ref >39.00)
LDL CALC: 75 mg/dL (ref 0–99)
NONHDL: 81.66
Triglycerides: 35 mg/dL (ref 0.0–149.0)
VLDL: 7 mg/dL (ref 0.0–40.0)

## 2015-03-23 LAB — COMPREHENSIVE METABOLIC PANEL
ALT: 9 U/L (ref 0–35)
AST: 13 U/L (ref 0–37)
Albumin: 4 g/dL (ref 3.5–5.2)
Alkaline Phosphatase: 68 U/L (ref 39–117)
BUN: 9 mg/dL (ref 6–23)
CALCIUM: 9.1 mg/dL (ref 8.4–10.5)
CHLORIDE: 105 meq/L (ref 96–112)
CO2: 26 meq/L (ref 19–32)
CREATININE: 0.93 mg/dL (ref 0.40–1.20)
GFR: 92.58 mL/min (ref >60.00)
Glucose, Bld: 87 mg/dL (ref 70–99)
Potassium: 4 mEq/L (ref 3.5–5.1)
SODIUM: 138 meq/L (ref 135–145)
Total Bilirubin: 0.5 mg/dL (ref 0.2–1.2)
Total Protein: 7.3 g/dL (ref 6.0–8.3)

## 2015-03-23 LAB — VITAMIN D 25 HYDROXY (VIT D DEFICIENCY, FRACTURES): VITD: 25.1 ng/mL — AB (ref 30.00–100.00)

## 2015-03-23 LAB — TSH: TSH: 0.83 u[IU]/mL (ref 0.35–4.50)

## 2015-03-23 NOTE — Patient Instructions (Signed)
We will check the blood work today and call you back with the results.   We will also check the x-ray of the spine and call you back. This will show the bones and the alignment.   Health Maintenance, Female Adopting a healthy lifestyle and getting preventive care can go a long way to promote health and wellness. Talk with your health care provider about what schedule of regular examinations is right for you. This is a good chance for you to check in with your provider about disease prevention and staying healthy. In between checkups, there are plenty of things you can do on your own. Experts have done a lot of research about which lifestyle changes and preventive measures are most likely to keep you healthy. Ask your health care provider for more information. WEIGHT AND DIET  Eat a healthy diet  Be sure to include plenty of vegetables, fruits, low-fat dairy products, and lean protein.  Do not eat a lot of foods high in solid fats, added sugars, or salt.  Get regular exercise. This is one of the most important things you can do for your health.  Most adults should exercise for at least 150 minutes each week. The exercise should increase your heart rate and make you sweat (moderate-intensity exercise).  Most adults should also do strengthening exercises at least twice a week. This is in addition to the moderate-intensity exercise.  Maintain a healthy weight  Body mass index (BMI) is a measurement that can be used to identify possible weight problems. It estimates body fat based on height and weight. Your health care provider can help determine your BMI and help you achieve or maintain a healthy weight.  For females 20 years of age and older:   A BMI below 18.5 is considered underweight.  A BMI of 18.5 to 24.9 is normal.  A BMI of 25 to 29.9 is considered overweight.  A BMI of 30 and above is considered obese.  Watch levels of cholesterol and blood lipids  You should start having  your blood tested for lipids and cholesterol at 27 years of age, then have this test every 5 years.  You may need to have your cholesterol levels checked more often if:  Your lipid or cholesterol levels are high.  You are older than 27 years of age.  You are at high risk for heart disease.  CANCER SCREENING   Lung Cancer  Lung cancer screening is recommended for adults 21-70 years old who are at high risk for lung cancer because of a history of smoking.  A yearly low-dose CT scan of the lungs is recommended for people who:  Currently smoke.  Have quit within the past 15 years.  Have at least a 30-pack-year history of smoking. A pack year is smoking an average of one pack of cigarettes a day for 1 year.  Yearly screening should continue until it has been 15 years since you quit.  Yearly screening should stop if you develop a health problem that would prevent you from having lung cancer treatment.  Breast Cancer  Practice breast self-awareness. This means understanding how your breasts normally appear and feel.  It also means doing regular breast self-exams. Let your health care provider know about any changes, no matter how small.  If you are in your 20s or 30s, you should have a clinical breast exam (CBE) by a health care provider every 1-3 years as part of a regular health exam.  If you are  30 or older, have a CBE every year. Also consider having a breast X-ray (mammogram) every year.  If you have a family history of breast cancer, talk to your health care provider about genetic screening.  If you are at high risk for breast cancer, talk to your health care provider about having an MRI and a mammogram every year.  Breast cancer gene (BRCA) assessment is recommended for women who have family members with BRCA-related cancers. BRCA-related cancers include:  Breast.  Ovarian.  Tubal.  Peritoneal cancers.  Results of the assessment will determine the need for genetic  counseling and BRCA1 and BRCA2 testing. Cervical Cancer Your health care provider may recommend that you be screened regularly for cancer of the pelvic organs (ovaries, uterus, and vagina). This screening involves a pelvic examination, including checking for microscopic changes to the surface of your cervix (Pap test). You may be encouraged to have this screening done every 3 years, beginning at age 23.  For women ages 41-65, health care providers may recommend pelvic exams and Pap testing every 3 years, or they may recommend the Pap and pelvic exam, combined with testing for human papilloma virus (HPV), every 5 years. Some types of HPV increase your risk of cervical cancer. Testing for HPV may also be done on women of any age with unclear Pap test results.  Other health care providers may not recommend any screening for nonpregnant women who are considered low risk for pelvic cancer and who do not have symptoms. Ask your health care provider if a screening pelvic exam is right for you.  If you have had past treatment for cervical cancer or a condition that could lead to cancer, you need Pap tests and screening for cancer for at least 20 years after your treatment. If Pap tests have been discontinued, your risk factors (such as having a new sexual partner) need to be reassessed to determine if screening should resume. Some women have medical problems that increase the chance of getting cervical cancer. In these cases, your health care provider may recommend more frequent screening and Pap tests. Colorectal Cancer  This type of cancer can be detected and often prevented.  Routine colorectal cancer screening usually begins at 27 years of age and continues through 27 years of age.  Your health care provider may recommend screening at an earlier age if you have risk factors for colon cancer.  Your health care provider may also recommend using home test kits to check for hidden blood in the stool.  A  small camera at the end of a tube can be used to examine your colon directly (sigmoidoscopy or colonoscopy). This is done to check for the earliest forms of colorectal cancer.  Routine screening usually begins at age 65.  Direct examination of the colon should be repeated every 5-10 years through 27 years of age. However, you may need to be screened more often if early forms of precancerous polyps or small growths are found. Skin Cancer  Check your skin from head to toe regularly.  Tell your health care provider about any new moles or changes in moles, especially if there is a change in a mole's shape or color.  Also tell your health care provider if you have a mole that is larger than the size of a pencil eraser.  Always use sunscreen. Apply sunscreen liberally and repeatedly throughout the day.  Protect yourself by wearing long sleeves, pants, a wide-brimmed hat, and sunglasses whenever you are outside. HEART  DISEASE, DIABETES, AND HIGH BLOOD PRESSURE   High blood pressure causes heart disease and increases the risk of stroke. High blood pressure is more likely to develop in:  People who have blood pressure in the high end of the normal range (130-139/85-89 mm Hg).  People who are overweight or obese.  People who are African American.  If you are 21-31 years of age, have your blood pressure checked every 3-5 years. If you are 67 years of age or older, have your blood pressure checked every year. You should have your blood pressure measured twice--once when you are at a hospital or clinic, and once when you are not at a hospital or clinic. Record the average of the two measurements. To check your blood pressure when you are not at a hospital or clinic, you can use:  An automated blood pressure machine at a pharmacy.  A home blood pressure monitor.  If you are between 34 years and 65 years old, ask your health care provider if you should take aspirin to prevent strokes.  Have  regular diabetes screenings. This involves taking a blood sample to check your fasting blood sugar level.  If you are at a normal weight and have a low risk for diabetes, have this test once every three years after 27 years of age.  If you are overweight and have a high risk for diabetes, consider being tested at a younger age or more often. PREVENTING INFECTION  Hepatitis B  If you have a higher risk for hepatitis B, you should be screened for this virus. You are considered at high risk for hepatitis B if:  You were born in a country where hepatitis B is common. Ask your health care provider which countries are considered high risk.  Your parents were born in a high-risk country, and you have not been immunized against hepatitis B (hepatitis B vaccine).  You have HIV or AIDS.  You use needles to inject street drugs.  You live with someone who has hepatitis B.  You have had sex with someone who has hepatitis B.  You get hemodialysis treatment.  You take certain medicines for conditions, including cancer, organ transplantation, and autoimmune conditions. Hepatitis C  Blood testing is recommended for:  Everyone born from [birth year] through 1963.  Anyone with known risk factors for hepatitis C. Sexually transmitted infections (STIs)  You should be screened for sexually transmitted infections (STIs) including gonorrhea and chlamydia if:  You are sexually active and are younger than 27 years of age.  You are older than 27 years of age and your health care provider tells you that you are at risk for this type of infection.  Your sexual activity has changed since you were last screened and you are at an increased risk for chlamydia or gonorrhea. Ask your health care provider if you are at risk.  If you do not have HIV, but are at risk, it may be recommended that you take a prescription medicine daily to prevent HIV infection. This is called pre-exposure prophylaxis (PrEP). You are  considered at risk if:  You are sexually active and do not regularly use condoms or know the HIV status of your partner(s).  You take drugs by injection.  You are sexually active with a partner who has HIV. Talk with your health care provider about whether you are at high risk of being infected with HIV. If you choose to begin PrEP, you should first be tested for HIV. You should  then be tested every 3 months for as long as you are taking PrEP.  PREGNANCY   If you are premenopausal and you may become pregnant, ask your health care provider about preconception counseling.  If you may become pregnant, take 400 to 800 micrograms (mcg) of folic acid every day.  If you want to prevent pregnancy, talk to your health care provider about birth control (contraception). OSTEOPOROSIS AND MENOPAUSE   Osteoporosis is a disease in which the bones lose minerals and strength with aging. This can result in serious bone fractures. Your risk for osteoporosis can be identified using a bone density scan.  If you are 42 years of age or older, or if you are at risk for osteoporosis and fractures, ask your health care provider if you should be screened.  Ask your health care provider whether you should take a calcium or vitamin D supplement to lower your risk for osteoporosis.  Menopause may have certain physical symptoms and risks.  Hormone replacement therapy may reduce some of these symptoms and risks. Talk to your health care provider about whether hormone replacement therapy is right for you.  HOME CARE INSTRUCTIONS   Schedule regular health, dental, and eye exams.  Stay current with your immunizations.   Do not use any tobacco products including cigarettes, chewing tobacco, or electronic cigarettes.  If you are pregnant, do not drink alcohol.  If you are breastfeeding, limit how much and how often you drink alcohol.  Limit alcohol intake to no more than 1 drink per day for nonpregnant women. One  drink equals 12 ounces of beer, 5 ounces of wine, or 1 ounces of hard liquor.  Do not use street drugs.  Do not share needles.  Ask your health care provider for help if you need support or information about quitting drugs.  Tell your health care provider if you often feel depressed.  Tell your health care provider if you have ever been abused or do not feel safe at home.   This information is not intended to replace advice given to you by your health care provider. Make sure you discuss any questions you have with your health care provider.   Document Released: 11/13/2010 Document Revised: 05/21/2014 Document Reviewed: 04/01/2013 Elsevier Interactive Patient Education [employer].

## 2015-03-23 NOTE — Progress Notes (Signed)
   Subjective:    Patient ID: Elizabeth Stevenson, female    DOB: Aug 22, 1985, 29 y.o.   MRN: [id number]  HPI The patient is a 29 YO female coming in for wellness. She does have numerous complaints and we are not able to address all of them. See A/P for details.  [facility], [facility], social history reviewed and updated.   Review of Systems  Constitutional: Negative for appetite change and fatigue.       Gone now  HENT: Negative for ear discharge, facial swelling and hearing loss.        Tonsil problem with drainage  Respiratory: Negative for cough, chest tightness and shortness of breath.   Cardiovascular: Negative for chest pain, palpitations and leg swelling.  Gastrointestinal: Positive for diarrhea and constipation. Negative for abdominal pain and abdominal distention.       Alternating  Musculoskeletal: Positive for myalgias and back pain.  Skin: Negative.   Neurological: Positive for numbness. Negative for dizziness, weakness and light-headedness.  Psychiatric/Behavioral: Negative.       Objective:   Physical Exam  Constitutional: She appears well-developed and well-nourished.  Overweight  HENT:  Head: Normocephalic and atraumatic.  Tonsils large, no abscess or caught food that I can see  Eyes: EOM are normal.  Neck: Normal range of motion.  Cardiovascular: Normal rate and regular rhythm.   Pulmonary/Chest: Effort normal.  Abdominal: Soft. Bowel sounds are normal. She exhibits no distension. There is no tenderness.  Musculoskeletal: She exhibits tenderness.  Tenderness paraspinal thoracic and cervical spine, no spinal tenderness  Neurological: No cranial nerve deficit. Coordination normal.  Skin: Skin is warm and dry.   Filed Vitals:   03/23/15 0807  BP: 120/78  Pulse: 74  Temp: 98.5 F (36.9 C)  TempSrc: Oral  Resp: 12  Height: 5\' 4"  (1.626 m)  Weight: 246 lb (111.585 kg)  SpO2: 98%      Assessment & Plan:

## 2015-03-23 NOTE — Assessment & Plan Note (Signed)
No abscess but her tonsils are on the large side and she thinks that food is getting stuck and she has to remove otherwise it smells. No intervention at this time.

## 2015-03-23 NOTE — Assessment & Plan Note (Signed)
She did quit but is now smoking again and is thinking about quitting again.

## 2015-03-23 NOTE — Assessment & Plan Note (Signed)
Due to her numerous complaints we were unable to adequately address wellness today. She does declines the flu shot and health maintenance up to date.

## 2015-03-23 NOTE — Assessment & Plan Note (Signed)
Still having but has not done EMG but she does intend to do that in the future.

## 2015-03-23 NOTE — Assessment & Plan Note (Signed)
Back pain is mostly paraspinal and talked to her about PT for the pain and she declines. She does wish to get MRI but no indication for MRI imaging on exam. Worsening pain consistent with weight gain and we talked about how that is the likely cause and that she will continue to struggle with back pain. Checking x-ray as she is convinced that she has curvature of her spine.

## 2015-03-23 NOTE — Progress Notes (Signed)
Pre visit review using our clinic review tool, if applicable. No additional management support is needed unless otherwise documented below in the visit note. 

## 2015-03-23 NOTE — Assessment & Plan Note (Signed)
She has gained weight from last year and checking CMP and HgA1c today along with lipid panel. Suspect that the weight increase is causing the new back pain and talked to her about the need to exercise and work on consistent food intake. She will think about it.

## 2015-03-28 ENCOUNTER — Telehealth: Payer: Self-pay | Admitting: Internal Medicine

## 2015-03-28 DIAGNOSIS — M549 Dorsalgia, unspecified: Secondary | ICD-10-CM

## 2015-03-28 MED ORDER — VITAMIN D3 125 MCG (5000 UT) PO TABS
5000.0000 [IU] | ORAL_TABLET | Freq: Every day | ORAL | Status: DC
Start: 2015-03-28 — End: 2015-04-14

## 2015-03-28 NOTE — Telephone Encounter (Signed)
Patient aware and will start the vitamin D. She wants to know what she is supposed to do about the nodule on her neck. She says she thinks she needs to be referred to rheumatology for her back. She said she will try PT, but that is not really what she wants to do.

## 2015-03-28 NOTE — Telephone Encounter (Signed)
Have sent in the vitamin D 5000 units. This is an over the counter medicine so cannot guarantee that her insurance will cover. The back is likely muscular pain and we can have her do physical therapy as a next option if she wants.

## 2015-03-28 NOTE — Telephone Encounter (Signed)
Patient is requesting a follow up with her back. She wants to know what we are going to do. She cannot tolerate it and she does not want medicine, but she wants us to figure out what's going on with her back. Also, she wants us to send the prescription for vitamin D, so she does not have to pay for it.

## 2015-03-28 NOTE — Telephone Encounter (Signed)
Can you please call pt.  She has some questions regarding her appt last week regarding her back and neck

## 2015-03-29 NOTE — Telephone Encounter (Signed)
The nodule on her neck is not abnormal and nothing needs to be done with it. Have ordered PT and would like to see her back after that to evaluate the back. There is nothing a rheumatologist could do for that as she does not have a rheumatologic problem.

## 2015-03-29 NOTE — Telephone Encounter (Signed)
Informed pt of PCP notes and pt stated understanding.  Resent MD notes from imaging as she did not see the note from Dr. Okey Duprerawford.   Pt stated that her neck does not have the natural curve and she believes that this is the cause of her pain.

## 2015-03-29 NOTE — Addendum Note (Signed)
Addended by: Hillard DankerRAWFORD, Shawnell Dykes A on: 03/29/2015 08:19 AM   Modules accepted: Orders

## 2015-04-14 ENCOUNTER — Telehealth: Payer: Self-pay | Admitting: Internal Medicine

## 2015-04-14 ENCOUNTER — Other Ambulatory Visit: Payer: Self-pay | Admitting: Geriatric Medicine

## 2015-04-14 MED ORDER — VITAMIN D3 125 MCG (5000 UT) PO TABS: 5000.0000 [IU] | ORAL_TABLET | Freq: Every day | ORAL | Status: DC

## 2015-04-14 NOTE — Telephone Encounter (Signed)
[facility] on [address] would like to talk to you regarding Cholecalciferol (VITAMIN D3) 5000 UNITS TABS [[id number]][[id number]]

## 2015-04-22 NOTE — Telephone Encounter (Signed)
Received call from pharmacist stating wanting to inform md that pt was given 50,000 units of vitamin to take once a week. It was a mistake on their end. Pt has picked up the vitamin d 5000, and will be taking once a day...Raechel Chute/lmb

## 2015-07-11 ENCOUNTER — Other Ambulatory Visit: Payer: Self-pay | Admitting: Family Medicine

## 2015-07-11 ENCOUNTER — Ambulatory Visit
Admission: RE | Admit: 2015-07-11 | Discharge: 2015-07-11 | Disposition: A | Discharge status: Home / Self Care | Payer: 59 | Source: Ambulatory Visit | Attending: Family Medicine | Admitting: Family Medicine

## 2015-07-11 DIAGNOSIS — M5489 Other dorsalgia: Secondary | ICD-10-CM

## 2015-07-11 DIAGNOSIS — Z8739 Personal history of other diseases of the musculoskeletal system and connective tissue: Secondary | ICD-10-CM

## 2015-09-05 ENCOUNTER — Emergency Department (HOSPITAL_COMMUNITY)
Admission: EM | Admit: 2015-09-05 | Discharge: 2015-09-05 | Disposition: A | Discharge status: Home / Self Care | Payer: 59 | Attending: Emergency Medicine | Admitting: Emergency Medicine

## 2015-09-05 ENCOUNTER — Encounter (HOSPITAL_COMMUNITY): Payer: Self-pay | Admitting: Emergency Medicine

## 2015-09-05 ENCOUNTER — Emergency Department (HOSPITAL_COMMUNITY): Payer: 59

## 2015-09-05 DIAGNOSIS — F17210 Nicotine dependence, cigarettes, uncomplicated: Secondary | ICD-10-CM | POA: Diagnosis not present

## 2015-09-05 DIAGNOSIS — Z7951 Long term (current) use of inhaled steroids: Secondary | ICD-10-CM | POA: Insufficient documentation

## 2015-09-05 DIAGNOSIS — R079 Chest pain, unspecified: Secondary | ICD-10-CM | POA: Diagnosis not present

## 2015-09-05 DIAGNOSIS — Z8619 Personal history of other infectious and parasitic diseases: Secondary | ICD-10-CM | POA: Insufficient documentation

## 2015-09-05 DIAGNOSIS — R51 Headache: Secondary | ICD-10-CM | POA: Diagnosis not present

## 2015-09-05 DIAGNOSIS — Z8742 Personal history of other diseases of the female genital tract: Secondary | ICD-10-CM | POA: Diagnosis not present

## 2015-09-05 DIAGNOSIS — Z79899 Other long term (current) drug therapy: Secondary | ICD-10-CM | POA: Diagnosis not present

## 2015-09-05 DIAGNOSIS — Z8719 Personal history of other diseases of the digestive system: Secondary | ICD-10-CM | POA: Insufficient documentation

## 2015-09-05 DIAGNOSIS — R519 Headache, unspecified: Secondary | ICD-10-CM

## 2015-09-05 LAB — BASIC METABOLIC PANEL
Anion gap: 8 (ref 5–15)
BUN: 8 mg/dL (ref 6–20)
CO2: 22 mmol/L (ref 22–32)
Calcium: 8.4 mg/dL — ABNORMAL LOW (ref 8.9–10.3)
Chloride: 111 mmol/L (ref 101–111)
Creatinine, Ser: 0.9 mg/dL (ref 0.44–1.00)
GFR calc Af Amer: >60 mL/min (ref >60)
GFR calc non Af Amer: >60 mL/min (ref >60)
Glucose, Bld: 93 mg/dL (ref 65–99)
Potassium: 3.6 mmol/L (ref 3.5–5.1)
Sodium: 141 mmol/L (ref 135–145)

## 2015-09-05 LAB — CBC
HCT: 38.7 % (ref 36.0–46.0)
Hemoglobin: 13.1 g/dL (ref 12.0–15.0)
MCH: 31.4 pg (ref 26.0–34.0)
MCHC: 33.9 g/dL (ref 30.0–36.0)
MCV: 92.8 fL (ref 78.0–100.0)
Platelets: 295 10*3/uL (ref 150–400)
RBC: 4.17 MIL/uL (ref 3.87–5.11)
RDW: 12.9 % (ref 11.5–15.5)
WBC: 4.6 10*3/uL (ref 4.0–10.5)

## 2015-09-05 LAB — I-STAT TROPONIN, ED: Troponin i, poc: 0 ng/mL (ref 0.00–0.08)

## 2015-09-05 MED ORDER — SODIUM CHLORIDE 0.9 % IV BOLUS (SEPSIS)
1000.0000 mL | Freq: Once | INTRAVENOUS | Status: AC
  Administered 2015-09-05: 1000 mL via INTRAVENOUS

## 2015-09-05 MED ORDER — METOCLOPRAMIDE HCL 5 MG/ML IJ SOLN
10.0000 mg | Freq: Once | INTRAMUSCULAR | Status: AC
  Administered 2015-09-05: 10 mg via INTRAVENOUS
  Filled 2015-09-05: qty 2

## 2015-09-05 MED ORDER — DIPHENHYDRAMINE HCL 50 MG/ML IJ SOLN
25.0000 mg | Freq: Once | INTRAMUSCULAR | Status: AC
  Administered 2015-09-05: 25 mg via INTRAVENOUS
  Filled 2015-09-05: qty 1

## 2015-09-05 MED ORDER — KETOROLAC TROMETHAMINE 30 MG/ML IJ SOLN
30.0000 mg | Freq: Once | INTRAMUSCULAR | Status: AC
  Administered 2015-09-05: 30 mg via INTRAVENOUS
  Filled 2015-09-05: qty 1

## 2015-09-05 NOTE — ED Notes (Signed)
Rob, PA at bedside. 

## 2015-09-05 NOTE — ED Notes (Signed)
Pt transported to xray 

## 2015-09-05 NOTE — ED Notes (Signed)
Pt states she had a migraine yesterday with history of same, then started experiencing chest pain with palpitations around 9 last night. Pt also c/o dizziness when getting up. Pt denies N/V, denies SOB.

## 2015-09-05 NOTE — ED Provider Notes (Signed)
CSN: [id number]     Arrival date & time 09/05/15  0755 History   First MD Initiated Contact with Patient 09/05/15 [phone]     Chief Complaint  Patient presents with  . Chest Pain     (Consider location/radiation/quality/duration/timing/severity/associated sxs/prior Treatment) HPI Comments: Patient presents to the emergency department with chief complaint of chest pain. She states that she noticed some brief fleeting chest pain started last night. She states that it has been intermittent. She reports having had these symptoms for the past several months. She denies any associated shortness of breath. Denies any fevers, chills, cough, abdominal pain, nausea, vomiting, diarrhea, constipation, or dysuria. She also reports having a headache, and states that she has a history of migraines. She states that her headache started last night as well, and feels like it is developing into her typical migraine. She denies any history of heart or lung problems. Additionally, she states that she has been donating plasma frequently. She states that she has been declined a few times because of hypotension.  The history is provided by the patient. No language interpreter was used.    Past Medical History  Diagnosis Date  . Ovarian cyst   . Pelvic pain   . HSV-1 (herpes simplex virus 1) infection   . HSV-2 (herpes simplex virus 2) infection   . Abnormal Pap smear   . Chlamydia   . Ventricular tachycardia (HCC)   . Migraine   . GERD (gastroesophageal reflux disease)    Past Surgical History  Procedure Laterality Date  . Colposcopy  2008    has had procedure done couple times   Family History  Problem Relation Age of Onset  . Birth defects Sister   . Hypertension Maternal Grandmother   . Stroke Maternal Grandmother   . Diabetes Maternal Grandfather   . Diabetes Paternal Grandmother   . Hypertension Mother     Living  . Hyperlipidemia Father   . Hypertension Father   . Healthy Sister   . Healthy  Daughter    Social History  Substance Use Topics  . Smoking status: Current Every Day Smoker    Types: Cigars    Last Attempt to Quit: 02/11/2014  . Smokeless tobacco: Never Used  . Alcohol Use: 0.0 oz/week    0 Standard drinks or equivalent per week     Comment: 1 glass of wine, 2-3 times week   OB History    Gravida Para Term Preterm AB TAB SAB Ectopic Multiple Living   3 1 1  0 2 1 1  0 0 1     Review of Systems  Constitutional: Negative for fever and chills.  Respiratory: Negative for shortness of breath.   Cardiovascular: Positive for chest pain.  Gastrointestinal: Negative for nausea, vomiting, diarrhea and constipation.  Genitourinary: Negative for dysuria.  Neurological: Positive for headaches.      Allergies  Rondec  Home Medications   Prior to Admission medications   Medication Sig Start Date End Date Taking? Authorizing Provider  Cholecalciferol (VITAMIN D3) 5000 UNITS TABS Take 1 tablet (5,000 Units total) by mouth daily. 04/14/15   Myrlene BrokerElizabeth A Crawford, MD  eletriptan (RELPAX) 40 MG tablet Take 1 tablet (40 mg total) by mouth as needed for migraine or headache. May repeat in 2 hours if headache persists or recurs. 10/01/14   Donika K Patel, DO  fluticasone (FLONASE) 50 MCG/ACT nasal spray Place 2 sprays into both nostrils daily. 07/09/14   Myrlene BrokerElizabeth A Crawford, MD  loratadine (CLARITIN) 10  MG tablet Take 10 mg by mouth daily.    Historical Provider, MD  traMADol (ULTRAM) 50 MG tablet TK 1 T PO Q 6 H PRN P 11/29/14   Historical Provider, MD   BP 119/65 mmHg  Pulse 72  Temp(Src) 99.4 F (37.4 C) (Oral)  Resp 18  Ht  (1.626 m)  Wt 108.863 kg  BMI 41.18 kg/m2  SpO2 100%  LMP 08/05/2015 Physical Exam  Constitutional: She is oriented to person, place, and time. She appears well-developed and well-nourished.  HENT:  Head: Normocephalic and atraumatic.  Right Ear: External ear normal.  Left Ear: External ear normal.  Eyes: Conjunctivae and EOM are normal.  Pupils are equal, round, and reactive to light.  Neck: Normal range of motion. Neck supple.  No pain with neck flexion, no meningismus  Cardiovascular: Normal rate, regular rhythm and normal heart sounds.  Exam reveals no gallop and no friction rub.   No murmur heard. Pulmonary/Chest: Effort normal and breath sounds normal. No respiratory distress. She has no wheezes. She has no rales. She exhibits no tenderness.  Clear to auscultation bilaterally  Abdominal: Soft. Bowel sounds are normal. She exhibits no distension and no mass. There is no tenderness. There is no rebound and no guarding.  Musculoskeletal: Normal range of motion. She exhibits no edema or tenderness.  Normal gait.  Neurological: She is alert and oriented to person, place, and time.  CN 3-12 intact, normal finger to nose, no pronator drift, sensation and strength intact bilaterally.  Skin: Skin is warm and dry.  Psychiatric: She has a normal mood and affect. Her behavior is normal. Judgment and thought content normal.  Nursing note and vitals reviewed.   ED Course  Procedures (including critical care time) Results for orders placed or performed during the hospital encounter of 09/05/15  Basic metabolic panel  Result Value Ref Range   Sodium 141 135 - 145 mmol/L   Potassium 3.6 3.5 - 5.1 mmol/L   Chloride 111 101 - 111 mmol/L   CO2 22 22 - 32 mmol/L   Glucose, Bld 93 65 - 99 mg/dL   BUN 8 6 - 20 mg/dL   Creatinine, Ser 1.30 0.44 - 1.00 mg/dL   Calcium 8.4 (L) 8.9 - 10.3 mg/dL   GFR calc non Af Amer >60 >60 mL/min   GFR calc Af Amer >60 >60 mL/min   Anion gap 8 5 - 15  CBC  Result Value Ref Range   WBC 4.6 4.0 - 10.5 K/uL   RBC 4.17 3.87 - 5.11 MIL/uL   Hemoglobin 13.1 12.0 - 15.0 g/dL   HCT 86.5 78.4 - 69.6 %   MCV 92.8 78.0 - 100.0 fL   MCH 31.4 26.0 - 34.0 pg   MCHC 33.9 30.0 - 36.0 g/dL   RDW 29.5 28.4 - 13.2 %   Platelets 295 150 - 400 K/uL  I-stat troponin, ED  Result Value Ref Range   Troponin i, poc  0.00 0.00 - 0.08 ng/mL   Comment 3           Dg Chest 2 View  09/05/2015  CLINICAL DATA:  Chest pain. EXAM: CHEST  2 VIEW COMPARISON:  07/11/2015 .  05/15/2011. FINDINGS: Mediastinum and hilar structures normal. Lungs are clear. Heart size normal. No pleural effusion or pneumothorax. Degenerative changes thoracic spine. IMPRESSION: No acute cardiopulmonary disease. Electronically Signed   By: Maisie Fus  Register   On: 09/05/2015 08:46    I have personally reviewed and evaluated  these images and lab results as part of my medical decision-making.   EKG Interpretation   Date/Time:  Monday September 05 2015 08:03:15 EDT Ventricular Rate:  71 PR Interval:  134 QRS Duration: 76 QT Interval:  392 QTC Calculation: 425 R Axis:   43 Text Interpretation:  Normal sinus rhythm No significant change since last  tracing Confirmed by KOHUT  MD, STEPHEN ([id number]) on 09/05/2015 9:16:53 AM      MDM   Final diagnoses:  Chest pain, unspecified chest pain type  Nonintractable headache, unspecified chronicity pattern, unspecified headache type   Patient with chest pain and headache. She states that she has had fleeting brief episodes chest pain for several months, but states that it was different last night. No associated shortness of breath, cough, or fever. No history of heart or lung problems. EKG and labs ordered in triage. Will add chest x-ray. Doubt PE.  Wells' PE criteria is 0.  Patient has an implanon implant in left arm, but this is expired and she is having it taken out tomorrow.  Will also treat headache, which feels consistent with typical migraines for patient.   Troponin ordered in triage is negative.  CBC and BMP are unremarkable.  CXR is negative for acute process.  No ischemic changes on EKG.  Patient is low risk given age and risk factors for ACS.  9:22 AM Patient reassessed and feeling better.  No chest pain; no headache now.  Patient states that she feels jittery.  Likely from reglan.  Patient  offered additional benadryl, but declined.   Roxy Horseman, PA-C 09/05/15 1021  Raeford Razor, MD 09/11/15 2131

## 2015-09-05 NOTE — Discharge Instructions (Signed)
General Headache Without Cause °A headache is pain or discomfort felt around the head or neck area. The specific cause of a headache may not be found. There are many causes and types of headaches. A few common ones are: °· Tension headaches. °· Migraine headaches. °· Cluster headaches. °· Chronic daily headaches. °HOME CARE INSTRUCTIONS  °Watch your condition for any changes. Take these steps to help with your condition: °Managing Pain °· Take over-the-counter and prescription medicines only as told by your health care provider. °· Lie down in a dark, quiet room when you have a headache. °· If directed, apply ice to the head and neck area: °· Put ice in a plastic bag. °· Place a towel between your skin and the bag. °· Leave the ice on for 20 minutes, 2-3 times per day. °· Use a heating pad or hot shower to apply heat to the head and neck area as told by your health care provider. °· Keep lights dim if bright lights bother you or make your headaches worse. °Eating and Drinking °· Eat meals on a regular schedule. °· Limit alcohol use. °· Decrease the amount of caffeine you drink, or stop drinking caffeine. °General Instructions °· Keep all follow-up visits as told by your health care provider. This is important. °· Keep a headache journal to help find out what may trigger your headaches. For example, write down: °· What you eat and drink. °· How much sleep you get. °· Any change to your diet or medicines. °· Try massage or other relaxation techniques. °· Limit stress. °· Sit up straight, and do not tense your muscles. °· Do not use tobacco products, including cigarettes, chewing tobacco, or e-cigarettes. If you need help quitting, ask your health care provider. °· Exercise regularly as told by your health care provider. °· Sleep on a regular schedule. Get 7-9 hours of sleep, or the amount recommended by your health care provider. °SEEK MEDICAL CARE IF:  °· Your symptoms are not helped by medicine. °· You have a  headache that is different from the usual headache. °· You have nausea or you vomit. °· You have a fever. °SEEK IMMEDIATE MEDICAL CARE IF:  °· Your headache becomes severe. °· You have repeated vomiting. °· You have a stiff neck. °· You have a loss of vision. °· You have problems with speech. °· You have pain in the eye or ear. °· You have muscular weakness or loss of muscle control. °· You lose your balance or have trouble walking. °· You feel faint or pass out. °· You have confusion. °  °This information is not intended to replace advice given to you by your health care provider. Make sure you discuss any questions you have with your health care provider. °  °Document Released: 04/30/2005 Document Revised: 01/19/2015 Document Reviewed: 08/23/2014 °Elsevier Interactive Patient Education ©2016 [employer] ° °Nonspecific Chest Pain  °Chest pain can be caused by many different conditions. There is always a chance that your pain could be related to something serious, such as a heart attack or a blood clot in your lungs. Chest pain can also be caused by conditions that are not life-threatening. If you have chest pain, it is very important to follow up with your health care provider. °CAUSES  °Chest pain can be caused by: °· Heartburn. °· Pneumonia or bronchitis. °· Anxiety or stress. °· Inflammation around your heart (pericarditis) or lung (pleuritis or pleurisy). °· A blood clot in your lung. °· A collapsed   lung (pneumothorax). It can develop suddenly on its own (spontaneous pneumothorax) or from trauma to the chest. °· Shingles infection (varicella-zoster virus). °· Heart attack. °· Damage to the bones, muscles, and cartilage that make up your chest wall. This can include: °¨ Bruised bones due to injury. °¨ Strained muscles or cartilage due to frequent or repeated coughing or overwork. °¨ Fracture to one or more ribs. °¨ Sore cartilage due to inflammation (costochondritis). °RISK FACTORS  °Risk factors for chest  pain may include: °· Activities that increase your risk for trauma or injury to your chest. °· Respiratory infections or conditions that cause frequent coughing. °· Medical conditions or overeating that can cause heartburn. °· Heart disease or family history of heart disease. °· Conditions or health behaviors that increase your risk of developing a blood clot. °· Having had chicken pox (varicella zoster). °SIGNS AND SYMPTOMS °Chest pain can feel like: °· Burning or tingling on the surface of your chest or deep in your chest. °· Crushing, pressure, aching, or squeezing pain. °· Dull or sharp pain that is worse when you move, cough, or take a deep breath. °· Pain that is also felt in your back, neck, shoulder, or arm, or pain that spreads to any of these areas. °Your chest pain may come and go, or it may stay constant. °DIAGNOSIS °Lab tests or other studies may be needed to find the cause of your pain. Your health care provider may have you take a test called an ambulatory ECG (electrocardiogram). An ECG records your heartbeat patterns at the time the test is performed. You may also have other tests, such as: °· Transthoracic echocardiogram (TTE). During echocardiography, sound waves are used to create a picture of all of the heart structures and to look at how blood flows through your heart. °· Transesophageal echocardiogram (TEE). This is a more advanced imaging test that obtains images from inside your body. It allows your health care provider to see your heart in finer detail. °· Cardiac monitoring. This allows your health care provider to monitor your heart rate and rhythm in real time. °· Holter monitor. This is a portable device that records your heartbeat and can help to diagnose abnormal heartbeats. It allows your health care provider to track your heart activity for several days, if needed. °· Stress tests. These can be done through exercise or by taking medicine that makes your heart beat more  quickly. °· Blood tests. °· Imaging tests. °TREATMENT  °Your treatment depends on what is causing your chest pain. Treatment may include: °· Medicines. These may include: °¨ Acid blockers for heartburn. °¨ Anti-inflammatory medicine. °¨ Pain medicine for inflammatory conditions. °¨ Antibiotic medicine, if an infection is present. °¨ Medicines to dissolve blood clots. °¨ Medicines to treat coronary artery disease. °· Supportive care for conditions that do not require medicines. This may include: °¨ Resting. °¨ Applying heat or cold packs to injured areas. °¨ Limiting activities until pain decreases. °HOME CARE INSTRUCTIONS °· If you were prescribed an antibiotic medicine, finish it all even if you start to feel better. °· Avoid any activities that bring on chest pain. °· Do not use any tobacco products, including cigarettes, chewing tobacco, or electronic cigarettes. If you need help quitting, ask your health care provider. °· Do not drink alcohol. °· Take medicines only as directed by your health care provider. °· Keep all follow-up visits as directed by your health care provider. This is important. This includes any further testing if your chest pain   does not go away. °· If heartburn is the cause for your chest pain, you may be told to keep your head raised (elevated) while sleeping. This reduces the chance that acid will go from your stomach into your esophagus. °· Make lifestyle changes as directed by your health care provider. These may include: °¨ Getting regular exercise. Ask your health care provider to suggest some activities that are safe for you. °¨ Eating a heart-healthy diet. A registered dietitian can help you to learn healthy eating options. °¨ Maintaining a healthy weight. °¨ Managing diabetes, if necessary. °¨ Reducing stress. °SEEK MEDICAL CARE IF: °· Your chest pain does not go away after treatment. °· You have a rash with blisters on your chest. °· You have a fever. °SEEK IMMEDIATE MEDICAL CARE  IF:  °· Your chest pain is worse. °· You have an increasing cough, or you cough up blood. °· You have severe abdominal pain. °· You have severe weakness. °· You faint. °· You have chills. °· You have sudden, unexplained chest discomfort. °· You have sudden, unexplained discomfort in your arms, back, neck, or jaw. °· You have shortness of breath at any time. °· You suddenly start to sweat, or your skin gets clammy. °· You feel nauseous or you vomit. °· You suddenly feel light-headed or dizzy. °· Your heart begins to beat quickly, or it feels like it is skipping beats. °These symptoms may represent a serious problem that is an emergency. Do not wait to see if the symptoms will go away. Get medical help right away. Call your local emergency services (911 in the [place] Do not drive yourself to the hospital. °  °This information is not intended to replace advice given to you by your health care provider. Make sure you discuss any questions you have with your health care provider. °  °Document Released: 02/07/2005 Document Revised: 05/21/2014 Document Reviewed: 12/04/2013 °Elsevier Interactive Patient Education ©2016 Elsevier Inc. ° °

## 2015-09-22 ENCOUNTER — Ambulatory Visit (INDEPENDENT_AMBULATORY_CARE_PROVIDER_SITE_OTHER): Payer: 59 | Admitting: Neurology

## 2015-09-22 ENCOUNTER — Encounter: Payer: Self-pay | Admitting: Neurology

## 2015-09-22 VITALS — BP 100/68 | HR 71 | Ht 64.0 in | Wt 247.4 lb

## 2015-09-22 DIAGNOSIS — R51 Headache: Secondary | ICD-10-CM

## 2015-09-22 DIAGNOSIS — F411 Generalized anxiety disorder: Secondary | ICD-10-CM

## 2015-09-22 DIAGNOSIS — G43109 Migraine with aura, not intractable, without status migrainosus: Secondary | ICD-10-CM | POA: Diagnosis not present

## 2015-09-22 DIAGNOSIS — R519 Headache, unspecified: Secondary | ICD-10-CM | POA: Insufficient documentation

## 2015-09-22 NOTE — Progress Notes (Signed)
Follow-up Visit   Date: 09/22/2015    Elizabeth Stevenson MRN: [id number] DOB: [birth year]   Interim History: Elizabeth Malessmeona K Loncar is a 30 y.o. right-handed African American female with migraines and GERD returning to the clinic for follow-up of new left head numbness.  The patient was accompanied to the clinic by self.  History of present illness: Starting around 2013, she noticed bilateral hand numbness and tingling sensation over the left forearm and upper arm. Symptoms can occur at rest and worse when she is sleeping. She notices is more when she is waking up and is easily able to shake her hands awake within a few minutes. Symptoms occur about 1-2 times per week. She has difficulty opening tops of bottles only with her right hand. She endorses neck and pain pain.   She complains of left hand tremors, especially when she is drinking extra caffeine or lifting heavy objects. This has occurred only a few times.   She also complains of numbness of the feet and swelling which had been ongoing for the past several years, but recently this has not been problematic.   Migraines started in 2011. Pain is located over the left frontal region, throbbing pain. Pain lasts all day, occuring about 11 times per month. She takes Aleve which does not help. Triggers include certain smells. She has occasional photophobia, phonophobia, n/v. She has imitrex but does not like the way it makes her feel.   UPDATE 12/09/2014:  Last week, she reports having numbness over the upper check and temporal region with pressure over the head.  This was followed by a migraine.  She used Relpax which helped her migraines and she tolerated it well without any side effects.  She has only had to use it three times since May.  She was concerned about the numbness because it has been a long time since she experienced this with a migraine.  There was no associated facial weakness, vision changes, or weakness.    She  continues to have bilateral arm numbness but accidentally missed her EMG appointment.   UPDATE 09/22/2015:  She was seen in the emergency department on 4/24 for atypical chest pain, anxiety, and severe migraine which responded to migraine cocktail.  She reports waking up several nights in a raw with shortness of breath and severe anxiety. She felt as if she was having a panic attack, similar to what she has previously experienced in 2008.  Her anxiety level is always high and she feels that anything stressful can make her more anxious.  In late April had moved to a different apartment and states that she may have triggered her anxiety and headache.   Overall, her migraines are well controlled, occuring about once per month.  She also complains of pressure over the left side of the head which is intermittent, occuring every few weeks.  It is always more noticeable at night.  There is no severe pain, but the discomfort is worrisome to her.  She takes Aleve about 1-2 times per week which usually helps.    Medications:  Current Outpatient Prescriptions on File Prior to Visit  Medication Sig Dispense Refill  . Cholecalciferol (VITAMIN D3) 5000 UNITS TABS Take 1 tablet (5,000 Units total) by mouth daily. 30 tablet 6  . eletriptan (RELPAX) 40 MG tablet Take 1 tablet (40 mg total) by mouth as needed for migraine or headache. May repeat in 2 hours if headache persists or recurs. 10 tablet 3  . fluticasone (  FLONASE) 50 MCG/ACT nasal spray Place 2 sprays into both nostrils daily. 16 g 6  . loratadine (CLARITIN) 10 MG tablet Take 10 mg by mouth daily.    . traMADol (ULTRAM) 50 MG tablet TK 1 T PO Q 6 H PRN P  0   No current facility-administered medications on file prior to visit.    Allergies:  Allergies  Allergen Reactions  . Rondec [Chlorpheniramine-Pseudoeph] Other (See Comments)    Hallucinations.  Not sure which formulation of rondec (brompheniramine vs. Chlorpheniramine)    Review of Systems:    CONSTITUTIONAL: No fevers, chills, night sweats, or weight loss.  EYES: No visual changes or eye pain ENT: No hearing changes.  No history of nose bleeds.   RESPIRATORY: No cough, wheezing and shortness of breath.   CARDIOVASCULAR: Negative for chest pain, and palpitations.   GI: Negative for abdominal discomfort, blood in stools or black stools.  No recent change in bowel habits.   GU:  No history of incontinence.   MUSCLOSKELETAL: No history of joint pain or swelling.  No myalgias.   SKIN: Negative for lesions, rash, and itching.   ENDOCRINE: Negative for cold or heat intolerance, polydipsia or goiter.   PSYCH:  No depression + anxiety symptoms.   NEURO: As Above.   Vital Signs:  BP 100/68 mmHg  Pulse 71  Ht  (1.626 m)  Wt 247 lb 7 oz (112.237 kg)  BMI 42.45 kg/m2  SpO2 99%  LMP 07/30/2015   Neurological Exam: MENTAL STATUS including orientation to time, place, person, recent and remote memory, attention span and concentration, language, and fund of knowledge is normal.  Speech is not dysarthric.  CRANIAL NERVES:  Pupils equal round and reactive to light.  Normal conjugate, extra-ocular eye movements in all directions of gaze.  No ptosis. Normal facial sensation.  Face is symmetric. Palate elevates symmetrically.  Tongue is midline.  MOTOR:  Motor strength is 5/5 in all extremities. No pronator drift.  Tone is normal.    SENSORY:  Intact to vibration.  REFLEXES:  Normal 2+/4 throughout  COORDINATION/GAIT: Gait narrow based and stable.    IMPRESSION/PLAN: 1.  Chronic daily headaches  - Start magnesium oxide 400-600mg  daily  - Limit ibuprofen, naproxen, and tylenol to twice per week  - Reassured patient that there is nothing worrisome on her exam  2.  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 Bilateral hand paresthesias, nonspecific with normal exam.Patient declined NCS/EMG.  4.  Anxiety and  panic attacks, follow-up with PCP  Return to clinic in 1 year   The duration of this appointment visit was 25 minutes of face-to-face time with the patient.  Greater than 50% of this time was spent in counseling, explanation of diagnosis, planning of further management, and coordination of care.   Thank you for allowing me to participate in patient's care.  If I can answer any additional questions, I would be pleased to do so.    Sincerely,    Cahlil Sattar K. Allena Katz, DO

## 2015-09-22 NOTE — Patient Instructions (Addendum)
Start magnesium oxide 400-600mg  daily Avoid ibuprofen, Aleve, and tylenol to twice per week Continue relpax as needed for severe migraine Talk to your primary care doctor about anxiety  Return to clinic in 1 year

## 2015-09-23 ENCOUNTER — Telehealth: Payer: Self-pay | Admitting: Neurology

## 2015-09-23 ENCOUNTER — Other Ambulatory Visit: Payer: Self-pay | Admitting: *Deleted

## 2015-09-23 MED ORDER — MAGNESIUM 400 MG PO TABS: 1.0000 | ORAL_TABLET | Freq: Every day | ORAL | Status: DC

## 2015-09-23 NOTE — Telephone Encounter (Signed)
Patient notified that Rx has been sent in. 

## 2015-09-23 NOTE — Telephone Encounter (Signed)
Elizabeth Stevenson [birth year]. She called wanting to see if her prescription for magnesium was sent [facility]   [address] Her number is [phone]. Thank you

## 2015-11-29 ENCOUNTER — Other Ambulatory Visit: Payer: Self-pay | Admitting: Gastroenterology

## 2015-11-29 DIAGNOSIS — R1011 Right upper quadrant pain: Secondary | ICD-10-CM

## 2015-12-09 ENCOUNTER — Encounter (HOSPITAL_COMMUNITY): Payer: Self-pay | Admitting: Radiology

## 2015-12-09 ENCOUNTER — Ambulatory Visit (HOSPITAL_COMMUNITY)
Admission: RE | Admit: 2015-12-09 | Discharge: 2015-12-09 | Disposition: A | Discharge status: Home / Self Care | Payer: 59 | Source: Ambulatory Visit | Attending: Gastroenterology | Admitting: Gastroenterology

## 2015-12-09 DIAGNOSIS — R1011 Right upper quadrant pain: Secondary | ICD-10-CM

## 2015-12-09 MED ORDER — TECHNETIUM TC 99M MEBROFENIN IV KIT
5.2000 | PACK | Freq: Once | INTRAVENOUS | Status: AC | PRN
  Administered 2015-12-09: 5 via INTRAVENOUS

## 2015-12-23 ENCOUNTER — Telehealth: Payer: Self-pay | Admitting: Neurology

## 2015-12-23 NOTE — Telephone Encounter (Signed)
Ok to write letter

## 2015-12-23 NOTE — Telephone Encounter (Signed)
Elizabeth Stevenson 1986-05-01. Her # is [phone]. She said she needed a form from Dr. Allena KatzPatel an accomodation form ? She is unable to work under the lighting at her work. It's giving her migraines. They said they could take them down if she could get a letter from Dr. Allena KatzPatel. Thank you

## 2015-12-23 NOTE — Telephone Encounter (Signed)
OK for patient to have reduced light at work for medical reasons.

## 2015-12-26 ENCOUNTER — Encounter: Payer: Self-pay | Admitting: *Deleted

## 2015-12-26 NOTE — Telephone Encounter (Signed)
Letter ready. Patient notified

## 2015-12-29 ENCOUNTER — Telehealth: Payer: Self-pay | Admitting: Neurology

## 2015-12-29 NOTE — Telephone Encounter (Signed)
PT called and wanted you to call her back she said she had a question to ask/Dawn

## 2015-12-30 NOTE — Telephone Encounter (Signed)
Patient notified form is ready.  She will be by on Monday to pick it up.

## 2016-01-04 ENCOUNTER — Telehealth: Payer: Self-pay | Admitting: Neurology

## 2016-01-04 NOTE — Telephone Encounter (Signed)
Elizabeth Stevenson please call the patient about the papers she picked up today. Has a mistake in question #7

## 2016-01-05 NOTE — Telephone Encounter (Signed)
Patient dropped off another form since there were errors on the last one.

## 2016-03-27 ENCOUNTER — Encounter: Payer: 59 | Admitting: Internal Medicine

## 2016-06-12 ENCOUNTER — Telehealth: Payer: Self-pay | Admitting: Neurology

## 2016-06-12 NOTE — Telephone Encounter (Signed)
PT called and said she is having a sharp pain on the left side of her head and needs a call back/Dawn CB# [phone]

## 2016-06-12 NOTE — Telephone Encounter (Signed)
She tried using her Relpax or naproxen 500 mg twice daily as needed?  If this does not help, please send prescription for nortriptyline 10 mg at bedtime daily.  Side effects include increased sedation, lightheadedness and grogginess but that such a low dose she should be okay.  Donika K. Allena KatzPatel, DO

## 2016-06-12 NOTE — Telephone Encounter (Signed)
Patient started having sharp pains this past weekend and it turned into a migraine.  She continues to have the sharp pains.  Is there anything she can take for this?  She does not want to come in if she does not have to if possible.

## 2016-06-13 ENCOUNTER — Other Ambulatory Visit: Payer: Self-pay | Admitting: *Deleted

## 2016-06-13 MED ORDER — NORTRIPTYLINE HCL 10 MG PO CAPS: 10.0000 mg | ORAL_CAPSULE | Freq: Every day | ORAL | 1 refills | Status: DC

## 2016-06-13 NOTE — Telephone Encounter (Signed)
I spoke with patient and she said that she would like to try the nortriptylline.  Rx sent.

## 2016-09-18 ENCOUNTER — Other Ambulatory Visit: Payer: Self-pay | Admitting: Gastroenterology

## 2016-09-18 DIAGNOSIS — R1033 Periumbilical pain: Secondary | ICD-10-CM

## 2016-09-19 ENCOUNTER — Encounter: Payer: Self-pay | Admitting: Radiology

## 2016-09-19 ENCOUNTER — Ambulatory Visit
Admission: RE | Admit: 2016-09-19 | Discharge: 2016-09-19 | Disposition: A | Discharge status: Home / Self Care | Payer: 59 | Source: Ambulatory Visit | Attending: Gastroenterology | Admitting: Gastroenterology

## 2016-09-19 DIAGNOSIS — R1033 Periumbilical pain: Secondary | ICD-10-CM

## 2016-09-19 MED ORDER — IOPAMIDOL (ISOVUE-300) INJECTION 61%
125.0000 mL | Freq: Once | INTRAVENOUS | Status: AC | PRN
  Administered 2016-09-19: 125 mL via INTRAVENOUS

## 2016-09-21 ENCOUNTER — Ambulatory Visit: Payer: 59 | Admitting: Neurology

## 2016-11-29 DIAGNOSIS — R748 Abnormal levels of other serum enzymes: Secondary | ICD-10-CM | POA: Insufficient documentation

## 2016-11-29 NOTE — Progress Notes (Addendum)
Office Visit Note  Patient: Elizabeth Stevenson             Date of Birth: 23-Apr-1986           MRN: [id number]             PCP: Marda Stalker, PA-C Referring: Delsa Bern, MD Visit Date: 12/03/2016 Occupation:Medical coder    Subjective:  Elevated CK.   History of Present Illness: Elizabeth Stevenson is a 31 y.o. female seen in consultation per request of Dr. Cletis Media. According to patient in 2010 she delivered her daughter and after that she started having increased swelling in her hands and feet. She also was experiencing some numbness and tingling in her hands. She notices some increased swelling especially in the morning. She has some intermittent problems with the swelling. She states she was recently participated in a  clinical trial for yeast infection. During the time she was having blood draws every 6 weeks. She states her initial CK level was elevated. Then she had a repeat CK level which was about 2000 and the third CK level was also 1000. She was referred to me for evaluation of elevated CK level by her GYN. She states she does have some muscle pain but no muscle weakness . She does have some lower back pain in her muscle spasms. She denies any history of trauma. She's not lifting any weights. She states she does exercise sporadically. She is also had a history of vitamin D deficiency.  Activities of Daily Living:  Patient reports morning stiffness for 5 minute.   Patient Denies nocturnal pain.  Difficulty dressing/grooming: Denies Difficulty climbing stairs: Denies Difficulty getting out of chair: Denies Difficulty using hands for taps, buttons, cutlery, and/or writing: Denies   Review of Systems  Constitutional: Negative for fatigue, night sweats, weight gain, weight loss and weakness.  HENT: Positive for mouth dryness. Negative for mouth sores, trouble swallowing, trouble swallowing and nose dryness.   Eyes: Negative for pain, redness, visual disturbance and  dryness.  Respiratory: Negative for cough, shortness of breath and difficulty breathing.   Cardiovascular: Negative for chest pain, palpitations, hypertension, irregular heartbeat and swelling in legs/feet.  Gastrointestinal: Positive for constipation and diarrhea. Negative for blood in stool.       History of IBS  Endocrine: Negative for increased urination.  Genitourinary: Negative for vaginal dryness.  Musculoskeletal: Positive for arthralgias, joint pain, myalgias, morning stiffness and myalgias. Negative for joint swelling, muscle weakness and muscle tenderness.  Skin: Negative for color change, rash, hair loss, skin tightness, ulcers and sensitivity to sunlight.  Allergic/Immunologic: Negative for susceptible to infections.  Neurological: Negative for dizziness, memory loss and night sweats.  Hematological: Negative for swollen glands.  Psychiatric/Behavioral: Positive for sleep disturbance. Negative for depressed mood. The patient is nervous/anxious.     PMFS History:  Patient Active Problem List   Diagnosis Date Noted  . Elevated CK 11/29/2016  . Chronic daily headache 09/22/2015  . Tobacco use disorder 03/23/2015  . Tonsil symptom 03/23/2015  . Routine general medical examination at a health care facility 03/23/2015  . Back pain 03/23/2015  . Numbness 08/28/2014  . Viral illness 08/28/2014  . Morbid obesity ([place]) 02/19/2014  . Leg skin lesion, left 01/21/2014  . Migraine 02/25/2013  . Allergic rhinitis 02/25/2013  . HSV-1 (herpes simplex virus 1) infection     Past Medical History:  Diagnosis Date  . Abnormal Pap smear   . Chlamydia   . GERD (gastroesophageal reflux  disease)   . HSV-1 (herpes simplex virus 1) infection   . HSV-2 (herpes simplex virus 2) infection   . Migraine   . Ovarian cyst   . Pelvic pain   . Ventricular tachycardia (HCC)     Family History  Problem Relation Age of Onset  . Hypertension Mother        Living  . Hyperlipidemia Father   .  Hypertension Father   . Hypertension Maternal Grandmother   . Stroke Maternal Grandmother   . Diabetes Maternal Grandfather   . Diabetes Paternal Grandmother   . Healthy Daughter    Past Surgical History:  Procedure Laterality Date  . COLPOSCOPY  2008   has had procedure done couple times  . 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   Highest level of education:  Some college   Caffeine Use-yes   Regular exercise-no     Objective: Vital Signs: BP 111/75 (BP Location: Left Arm, Patient Position: Sitting, Cuff Size: Normal)   Pulse 68   Ht 5' 4"  (1.626 m)   Wt 223 lb (101.2 kg)   BMI 38.28 kg/m    Physical Exam  Constitutional: She is oriented to person, place, and time. She appears well-developed and well-nourished.  HENT:  Head: Normocephalic and atraumatic.  Eyes: Conjunctivae and EOM are normal.  Neck: Normal range of motion.  Cardiovascular: Normal rate, regular rhythm, normal heart sounds and intact distal pulses.   Pulmonary/Chest: Effort normal and breath sounds normal.  Abdominal: Soft. Bowel sounds are normal.  Lymphadenopathy:    She has no cervical adenopathy.  Neurological: She is alert and oriented to person, place, and time.  Skin: Skin is warm and dry. Capillary refill takes less than 2 seconds.  Psychiatric: She has a normal mood and affect. Her behavior is normal.  Nursing note and vitals reviewed.    Musculoskeletal Exam: C-spine and thoracic lumbar spine good range of motion. Shoulder joints elbow joints wrist joint MCPs PIPs DIPs with good range of motion. Hip joints knee joints ankles MTPs PIPs DIPs are good range of motion with no synovitis. There was some muscular weakness or tenderness on examination.  CDAI Exam: No CDAI exam completed.    Investigation: Findings:  08/09/2016 CK elevated 2103 AST elevated 40, otherwise CMP normal,  08/06/2016 CK elevated 1001,CMP normal       Imaging: No results found.  Speciality Comments: No specialty comments available.    Procedures:  No procedures performed Allergies: Rondec [chlorpheniramine-pseudoeph]   Assessment / Plan:     Visit Diagnoses: Elevated CK -patient had elevated CK 2 in March and did not have anymore CKs reported since then. She denies any history of muscle weakness and muscle tenderness although she does experience some fatigue. I'll obtain following labs. Plan: Sedimentation rate, CK, TSH, ANA, Aldolase. If her repeat CKs elevated I will obtain myositis panel EMG and possible muscle biopsy to evaluate this further. She supposed to notify me if she develops any new symptoms.  Other fatigue - Plan: CBC with Differential/Platelet, COMPLETE METABOLIC PANEL WITH GFR, Urinalysis, Routine w reflex microscopic, Serum protein electrophoresis with reflex, IgG, IgA, IgM, VITAMIN D 25 Hydroxy (Vit-D Deficiency, Fractures), Hepatitis B core antibody, IgM, Hepatitis B surface antigen, Hepatitis C antibody  Polyarthralgia -she gives history of intermittent pain and swelling in her hands and feet. I do not see any synovitis on examination today. Obtain following  labs today. Plan: Rheumatoid factor, Cyclic citrul peptide antibody, IgG  Numbness  Vitamin D deficiency: Patient gives history of vitamin D deficiency in the past.  History of migraine  Tobacco use disorder : Smoking cessation was discussed. Orders: Orders Placed This Encounter  Procedures  . CBC with Differential/Platelet  . COMPLETE METABOLIC PANEL WITH GFR  . Urinalysis, Routine w reflex microscopic  . Sedimentation rate  . CK  . TSH  . ANA  . Rheumatoid factor  . Cyclic citrul peptide antibody, IgG  . Serum protein electrophoresis with reflex  . VITAMIN D 25 Hydroxy (Vit-D Deficiency, Fractures)  . Hepatitis B core antibody, IgM  . Hepatitis B surface antigen  . Hepatitis C antibody  . Aldolase  . CBC with Differential/Platelet  .  CMP14+EGFR  . Urinalysis, Routine w reflex microscopic  . Sedimentation rate  . CK  . TSH  . ANA  . Rheumatoid factor  . Protein electrophoresis, serum  . VITAMIN D 25 Hydroxy (Vit-D Deficiency, Fractures)  . Hepatitis B Core Antibody, IgM  . Hepatitis B Surface AntiGEN  . Hepatitis C Antibody  . Aldolase  . Other/Misc lab test  . IgG, IgA, IgM   No orders of the defined types were placed in this encounter.   Face-to-face time spent with patient was 50 minutes. 50% of time was spent in counseling and coordination of care.  Follow-Up Instructions: Return for Elevated CK.   Bo Merino, MD  Note - This record has been created using Editor, commissioning.  Chart creation errors have been sought, but may not always  have been located. Such creation errors do not reflect on  the standard of medical care.

## 2016-12-03 ENCOUNTER — Encounter: Payer: Self-pay | Admitting: Rheumatology

## 2016-12-03 ENCOUNTER — Ambulatory Visit (INDEPENDENT_AMBULATORY_CARE_PROVIDER_SITE_OTHER): Payer: 59 | Admitting: Rheumatology

## 2016-12-03 VITALS — BP 111/75 | HR 68 | Ht 64.0 in | Wt 223.0 lb

## 2016-12-03 DIAGNOSIS — F17200 Nicotine dependence, unspecified, uncomplicated: Secondary | ICD-10-CM | POA: Diagnosis not present

## 2016-12-03 DIAGNOSIS — R200 Anesthesia of skin: Secondary | ICD-10-CM

## 2016-12-03 DIAGNOSIS — R5383 Other fatigue: Secondary | ICD-10-CM | POA: Diagnosis not present

## 2016-12-03 DIAGNOSIS — R748 Abnormal levels of other serum enzymes: Secondary | ICD-10-CM

## 2016-12-03 DIAGNOSIS — Z8669 Personal history of other diseases of the nervous system and sense organs: Secondary | ICD-10-CM | POA: Diagnosis not present

## 2016-12-03 DIAGNOSIS — M2550 Pain in unspecified joint: Secondary | ICD-10-CM | POA: Diagnosis not present

## 2016-12-03 DIAGNOSIS — E559 Vitamin D deficiency, unspecified: Secondary | ICD-10-CM | POA: Diagnosis not present

## 2016-12-04 LAB — IGG, IGA, IGM
IGA/IMMUNOGLOBULIN A, SERUM: 176 mg/dL (ref 87–352)
IGG (IMMUNOGLOBIN G), SERUM: 697 mg/dL — AB (ref 700–1600)
IgM (Immunoglobulin M), Srm: 54 mg/dL (ref 26–217)

## 2016-12-04 NOTE — Progress Notes (Signed)
Vitamin D 50,000 units twice a week for 90 days. Check vitamin D level after 3 months

## 2016-12-05 NOTE — Progress Notes (Signed)
All other labs are normal

## 2016-12-06 LAB — PROTEIN ELECTROPHORESIS, SERUM
A/G Ratio: 1.2 (ref 0.7–1.7)
ALBUMIN ELP: 3.4 g/dL (ref 2.9–4.4)
ALPHA 1: 0.3 g/dL (ref 0.0–0.4)
ALPHA 2: 0.7 g/dL (ref 0.4–1.0)
BETA: 1.1 g/dL (ref 0.7–1.3)
Gamma Globulin: 0.7 g/dL (ref 0.4–1.8)
Globulin, Total: 2.8 g/dL (ref 2.2–3.9)
Total Protein: 6.2 g/dL (ref 6.0–8.5)

## 2016-12-06 LAB — URINALYSIS, ROUTINE W REFLEX MICROSCOPIC
BILIRUBIN UA: NEGATIVE
GLUCOSE, UA: NEGATIVE
Ketones, UA: NEGATIVE
Leukocytes, UA: NEGATIVE
Nitrite, UA: NEGATIVE
PROTEIN UA: NEGATIVE
RBC UA: NEGATIVE
Specific Gravity, UA: 1.021 (ref 1.005–1.030)
UUROB: 0.2 mg/dL (ref 0.2–1.0)
pH, UA: 7 (ref 5.0–7.5)

## 2016-12-06 LAB — CK: Total CK: 92 U/L (ref 24–173)

## 2016-12-06 LAB — CBC WITH DIFFERENTIAL/PLATELET
BASOS: 1 %
Basophils Absolute: 0 10*3/uL (ref 0.0–0.2)
EOS (ABSOLUTE): 0.1 10*3/uL (ref 0.0–0.4)
EOS: 2 %
HEMATOCRIT: 39.9 % (ref 34.0–46.6)
Hemoglobin: 12.8 g/dL (ref 11.1–15.9)
IMMATURE GRANULOCYTES: 0 %
Immature Grans (Abs): 0 10*3/uL (ref 0.0–0.1)
LYMPHS ABS: 2.5 10*3/uL (ref 0.7–3.1)
Lymphs: 45 %
MCH: 29.6 pg (ref 26.6–33.0)
MCHC: 32.1 g/dL (ref 31.5–35.7)
MCV: 92 fL (ref 79–97)
MONOS ABS: 0.4 10*3/uL (ref 0.1–0.9)
Monocytes: 7 %
NEUTROS ABS: 2.5 10*3/uL (ref 1.4–7.0)
NEUTROS PCT: 45 %
Platelets: 269 10*3/uL (ref 150–379)
RBC: 4.32 x10E6/uL (ref 3.77–5.28)
RDW: 14.7 % (ref 12.3–15.4)
WBC: 5.5 10*3/uL (ref 3.4–10.8)

## 2016-12-06 LAB — ALDOLASE: Aldolase: 4.4 U/L (ref 3.3–10.3)

## 2016-12-06 LAB — HEPATITIS B CORE ANTIBODY, IGM: Hep B C IgM: NEGATIVE

## 2016-12-06 LAB — VITAMIN D 25 HYDROXY (VIT D DEFICIENCY, FRACTURES): Vit D, 25-Hydroxy: 16.8 ng/mL — ABNORMAL LOW (ref 30.0–100.0)

## 2016-12-06 LAB — TSH: TSH: 0.681 u[IU]/mL (ref 0.450–4.500)

## 2016-12-06 LAB — SEDIMENTATION RATE: SED RATE: 2 mm/h (ref 0–32)

## 2016-12-06 LAB — ANA: Anti Nuclear Antibody(ANA): NEGATIVE

## 2016-12-06 LAB — HEPATITIS B SURFACE ANTIGEN: HEP B S AG: NEGATIVE

## 2016-12-06 LAB — RHEUMATOID FACTOR: Rhuematoid fact SerPl-aCnc: <10 IU/mL (ref 0.0–13.9)

## 2016-12-06 LAB — HEPATITIS C ANTIBODY

## 2016-12-10 ENCOUNTER — Telehealth: Payer: Self-pay | Admitting: Rheumatology

## 2016-12-10 NOTE — Telephone Encounter (Signed)
Fax# [phone] Please send a copy of last ov note to referring doctor.

## 2016-12-10 NOTE — Telephone Encounter (Signed)
Last office note faxed to referring doctor.

## 2016-12-12 ENCOUNTER — Telehealth: Payer: Self-pay | Admitting: *Deleted

## 2016-12-12 DIAGNOSIS — E559 Vitamin D deficiency, unspecified: Secondary | ICD-10-CM

## 2016-12-12 MED ORDER — VITAMIN D (ERGOCALCIFEROL) 1.25 MG (50000 UNIT) PO CAPS: 50000.0000 [IU] | ORAL_CAPSULE | ORAL | 0 refills | Status: DC

## 2016-12-12 NOTE — Telephone Encounter (Signed)
-----   Message from Pollyann SavoyShaili Deveshwar, MD sent at 12/05/2016  8:34 AM EDT ----- All other labs are normal

## 2016-12-12 NOTE — Telephone Encounter (Signed)
Left message for patient to call the office

## 2016-12-12 NOTE — Telephone Encounter (Signed)
Advised patient of lab results and sent prescription to pharmacy. Patient advised would discuss lab results in detail at new patient follow up. Patient verbalized understanding.

## 2016-12-12 NOTE — Telephone Encounter (Signed)
Patient returned Andrea's call.  

## 2016-12-12 NOTE — Telephone Encounter (Signed)
-----   Message from Pollyann SavoyShaili Deveshwar, MD sent at 12/04/2016 12:55 PM EDT ----- Vitamin D 50,000 units twice a week for 90 days. Check vitamin D level after 3 months

## 2017-01-09 ENCOUNTER — Encounter (HOSPITAL_COMMUNITY): Payer: Self-pay

## 2017-01-09 ENCOUNTER — Emergency Department (HOSPITAL_COMMUNITY)
Admission: EM | Admit: 2017-01-09 | Discharge: 2017-01-09 | Disposition: A | Discharge status: Home / Self Care | Payer: 59 | Attending: Emergency Medicine | Admitting: Emergency Medicine

## 2017-01-09 DIAGNOSIS — Z79899 Other long term (current) drug therapy: Secondary | ICD-10-CM | POA: Insufficient documentation

## 2017-01-09 DIAGNOSIS — F17290 Nicotine dependence, other tobacco product, uncomplicated: Secondary | ICD-10-CM | POA: Diagnosis not present

## 2017-01-09 DIAGNOSIS — M546 Pain in thoracic spine: Secondary | ICD-10-CM | POA: Diagnosis present

## 2017-01-09 DIAGNOSIS — M62838 Other muscle spasm: Secondary | ICD-10-CM | POA: Insufficient documentation

## 2017-01-09 MED ORDER — MELOXICAM 7.5 MG PO TABS: 7.5000 mg | ORAL_TABLET | Freq: Every day | ORAL | 0 refills | Status: DC

## 2017-01-09 MED ORDER — METHOCARBAMOL 500 MG PO TABS
500.0000 mg | ORAL_TABLET | Freq: Once | ORAL | Status: AC
  Administered 2017-01-09: 500 mg via ORAL
  Filled 2017-01-09: qty 1

## 2017-01-09 MED ORDER — METHOCARBAMOL 500 MG PO TABS: 500.0000 mg | ORAL_TABLET | Freq: Two times a day (BID) | ORAL | 0 refills | Status: DC

## 2017-01-09 NOTE — Discharge Instructions (Signed)
Please read attached information. If you experience any new or worsening signs or symptoms please return to the emergency room for evaluation. Please follow-up with your primary care provider or specialist as discussed. Please use medication prescribed only as directed and discontinue taking if you have any concerning signs or symptoms.   °

## 2017-01-09 NOTE — ED Triage Notes (Signed)
Patient complains of thoracic back pain since Saturday. Reports that the pain started after doing her hair, also complains of previous neck pain. Pain with any ROM and movement. Alert and oriented, NAD. Took mobic with minimal relief. VSS

## 2017-01-09 NOTE — ED Notes (Signed)
Pt verbalized understanding discharge instructions and denies any further needs or questions at this time. VS stable, ambulatory and steady gait.   

## 2017-01-09 NOTE — ED Provider Notes (Signed)
[facility]-EMERGENCY DEPT Provider Note   CSN: [id number] Arrival date & time: 01/09/17  1854     History   Chief Complaint Chief Complaint  Patient presents with  . Back Pain    HPI Elizabeth Stevenson is a 31 y.o. female.  HPI   30 year old female presents today with complaints of back pain.  Patient reports that on Saturday she was doing her hair and had pain in her posterior neck which is normal.  Patient notes that last night she woke up with pain down along the left lateral thoracic musculature, muscle spasms.  She denies any radiation of symptoms, denies any distal neurological deficits.  Patient reports taking meloxicam at home without significant improvement in her symptoms.  She denies any history of the same.  She denies any infectious etiology.  Patient reports pain with deep inspiration secondary to back pain, no acute shortness of breath or infectious etiology.   Past Medical History:  Diagnosis Date  . Abnormal Pap smear   . Chlamydia   . GERD (gastroesophageal reflux disease)   . HSV-1 (herpes simplex virus 1) infection   . HSV-2 (herpes simplex virus 2) infection   . Migraine   . Ovarian cyst   . Pelvic pain   . Ventricular tachycardia [facility])     Patient Active Problem List   Diagnosis Date Noted  . Elevated CK 11/29/2016  . Chronic daily headache 09/22/2015  . Tobacco use disorder 03/23/2015  . Tonsil symptom 03/23/2015  . Routine general medical examination at a health care facility 03/23/2015  . Back pain 03/23/2015  . Numbness 08/28/2014  . Viral illness 08/28/2014  . Morbid obesity (HCC) 02/19/2014  . Leg skin lesion, left 01/21/2014  . Migraine 02/25/2013  . Allergic rhinitis 02/25/2013  . HSV-1 (herpes simplex virus 1) infection     Past Surgical History:  Procedure Laterality Date  . COLPOSCOPY  2008   has had procedure done couple times  . MOUTH SURGERY      OB History    Gravida Para Term Preterm AB Living   3 1 1  0 2 1   SAB TAB  Ectopic Multiple Live Births   1 1 0 0         Home Medications    Prior to Admission medications   Medication Sig Start Date End Date Taking? Authorizing Provider  desloratadine (CLARINEX) 5 MG tablet TK 1 T PO QD 09/08/15   [provider]  eletriptan (RELPAX) 40 MG tablet Take 1 tablet (40 mg total) by mouth as needed for migraine or headache. May repeat in 2 hours if headache persists or recurs. 10/01/14   Patel, Donika K, DO  fluticasone (FLONASE) 50 MCG/ACT nasal spray Place 2 sprays into both nostrils daily. 07/09/14   Myrlene Broker, MD  loratadine (CLARITIN) 10 MG tablet Take 10 mg by mouth daily.    [provider]  meloxicam (MOBIC) 7.5 MG tablet Take 1 tablet (7.5 mg total) by mouth daily. 01/09/17   Harnoor Kohles, Tinnie Gens, PA-C  methocarbamol (ROBAXIN) 500 MG tablet Take 1 tablet (500 mg total) by mouth 2 (two) times daily. 01/09/17   Mariana Goytia, Tinnie Gens, PA-C  naproxen (NAPROSYN) 500 MG tablet TK 1 T PO Q 12 H PRN 07/08/15   [provider]  sertraline (ZOLOFT) 50 MG tablet Take 50 mg by mouth daily.    [provider]  Vitamin D, Ergocalciferol, (DRISDOL) 50000 units CAPS capsule Take 1 capsule (50,000 Units total) by mouth 2 (  two) times a week. 12/13/16   Pollyann Savoyeveshwar, Shaili, MD    Family History Family History  Problem Relation Age of Onset  . Hypertension Mother        Living  . Hyperlipidemia Father   . Hypertension Father   . Hypertension Maternal Grandmother   . Stroke Maternal Grandmother   . Diabetes Maternal Grandfather   . Diabetes Paternal Grandmother   . Healthy Daughter     Social History Social History  Substance Use Topics  . Smoking status: Current Every Day Smoker    Types: Cigars    Last attempt to quit: 02/11/2014  . Smokeless tobacco: Never Used  . Alcohol use 0.0 oz/week     Comment: 1 glass of wine, 2-3 times week     Allergies   Rondec [chlorpheniramine-pseudoeph]   Review of Systems Review of Systems  All  other systems reviewed and are negative.    Physical Exam Updated Vital Signs BP 122/90   Pulse 70   Temp 98.2 F (36.8 C) (Oral)   Resp 18   SpO2 100%   Physical Exam  Constitutional: She is oriented to person, place, and time. She appears well-developed and well-nourished.  HENT:  Head: Normocephalic and atraumatic.  Eyes: Pupils are equal, round, and reactive to light. Conjunctivae are normal. Right eye exhibits no discharge. Left eye exhibits no discharge. No scleral icterus.  Neck: Normal range of motion. No JVD present. No tracheal deviation present.  Pulmonary/Chest: Effort normal. No stridor.  Musculoskeletal:  No CT or L-spine tenderness palpation.  Tenderness palpation of the left lateral thoracic musculature with tense musculature.  Distal sensation strength and motor function intact.  Lung sounds clear bilateral.  Neurological: She is alert and oriented to person, place, and time. Coordination normal.  Psychiatric: She has a normal mood and affect. Her behavior is normal. Judgment and thought content normal.  Nursing note and vitals reviewed.    ED Treatments / Results  Labs (all labs ordered are listed, but only abnormal results are displayed) Labs Reviewed - No data to display  EKG  EKG Interpretation None       Radiology No results found.  Procedures Procedures (including critical care time)  Medications Ordered in ED Medications  methocarbamol (ROBAXIN) tablet 500 mg (500 mg Oral Given 01/09/17 2010)     Initial Impression / Assessment and Plan / ED Course  I have reviewed the triage vital signs and the nursing notes.  Pertinent labs & imaging results that were available during my care of the patient were reviewed by me and considered in my medical decision making (see chart for details).      Final Clinical Impressions(s) / ED Diagnoses   Final diagnoses:  Muscle spasm    Labs:   Imaging:  Consults:  Therapeutics:  Robaxin  Discharge Meds: Robaxin, Mobic  Assessment/Plan: Presents with muscle spasm.  This is dramatically improved with muscle relaxers here.  Patient having no shortness of breath or chest pain.  She has tenderness along the musculature.  No neurological deficits or infectious etiology.  She will be discharged home with meloxicam, muscle relaxers, outpatient follow-up and strict return precautions.  She verbalized understanding and agreement to today's plan.    New Prescriptions New Prescriptions   MELOXICAM (MOBIC) 7.5 MG TABLET    Take 1 tablet (7.5 mg total) by mouth daily.   METHOCARBAMOL (ROBAXIN) 500 MG TABLET    Take 1 tablet (500 mg total) by mouth 2 (two) times daily.  Eyvonne Mechanic, PA-C 01/09/17 2106    Bethann Berkshire, MD 01/10/17 [phone]

## 2017-01-18 ENCOUNTER — Ambulatory Visit: Payer: 59 | Admitting: Neurology

## 2017-01-20 NOTE — Progress Notes (Deleted)
Office Visit Note  Patient: Elizabeth Stevenson             Date of Birth: 01/18/1986           MRN: [id number]             PCP: Jarrett SohoWharton, Courtney, PA-C Referring: Clayborn Heronankins, Victoria R, MD Visit Date: 01/22/2017 Occupation: @GUAROCC @    Subjective:  No chief complaint on file.   History of Present Illness: Elizabeth Stevenson is a 31 y.o. female ***   Activities of Daily Living:  Patient reports morning stiffness for *** {minute/hour:19697}.   Patient {ACTIONS;DENIES/REPORTS:21021675::"Denies"} nocturnal pain.  Difficulty dressing/grooming: {ACTIONS;DENIES/REPORTS:21021675::"Denies"} Difficulty climbing stairs: {ACTIONS;DENIES/REPORTS:21021675::"Denies"} Difficulty getting out of chair: {ACTIONS;DENIES/REPORTS:21021675::"Denies"} Difficulty using hands for taps, buttons, cutlery, and/or writing: {ACTIONS;DENIES/REPORTS:21021675::"Denies"}   No Rheumatology ROS completed.   PMFS History:  Patient Active Problem List   Diagnosis Date Noted  . Elevated CK 11/29/2016  . Chronic daily headache 09/22/2015  . Tobacco use disorder 03/23/2015  . Tonsil symptom 03/23/2015  . Routine general medical examination at a health care facility 03/23/2015  . Back pain 03/23/2015  . Numbness 08/28/2014  . Viral illness 08/28/2014  . Morbid obesity ([facility]CC) 02/19/2014  . Leg skin lesion, left 01/21/2014  . Migraine 02/25/2013  . Allergic rhinitis 02/25/2013  . HSV-1 (herpes simplex virus 1) infection     Past Medical History:  Diagnosis Date  . Abnormal Pap smear   . Chlamydia   . GERD (gastroesophageal reflux disease)   . HSV-1 (herpes simplex virus 1) infection   . HSV-2 (herpes simplex virus 2) infection   . Migraine   . Ovarian cyst   . Pelvic pain   . Ventricular tachycardia (HCC)     Family History  Problem Relation Age of Onset  . Hypertension Mother        Living  . Hyperlipidemia Father   . Hypertension Father   . Hypertension Maternal Grandmother   . Stroke Maternal  Grandmother   . Diabetes Maternal Grandfather   . Diabetes Paternal Grandmother   . Healthy Daughter    Past Surgical History:  Procedure Laterality Date  . COLPOSCOPY  2008   has had procedure done couple times  . 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   Highest level of education:  Some college   Caffeine Use-yes   Regular exercise-no     Objective: Vital Signs: There were no vitals taken for this visit.   Physical Exam   Musculoskeletal Exam: ***  CDAI Exam: No CDAI exam completed.    Investigation: Findings:  08/09/2016 CK elevated 2103 AST elevated 40, otherwise CMP normal,  08/06/2016 CK elevated 1001,CMP normal   CBC Latest Ref Rng & Units 12/03/2016 09/05/2015 03/23/2015  WBC 3.4 - 10.8 x10E3/uL 5.5 4.6 5.2  Hemoglobin 11.1 - 15.9 g/dL 40.912.8 81.113.1 91.413.3  Hematocrit 34.0 - 46.6 % 39.9 38.7 39.8  Platelets 150 - 379 x10E3/uL 269 295 279.0   CMP     Component Value Date/Time   NA 141 09/05/2015 0812   K 3.6 09/05/2015 0812   CL 111 09/05/2015 0812   CO2 22 09/05/2015 0812   GLUCOSE 93 09/05/2015 0812   BUN 8 09/05/2015 0812   CREATININE 0.90 09/05/2015 0812   CALCIUM 8.4 (L) 09/05/2015 0812   PROT 6.2 12/03/2016 1228   ALBUMIN 4.0 03/23/2015 0923   AST 13 03/23/2015 [id number]  ALT 9 03/23/2015 0923   ALKPHOS 68 03/23/2015 0923   BILITOT 0.5 03/23/2015 0923   GFRNONAA >60 09/05/2015 0812   GFRAA >60 09/05/2015 0812   ESR2, CK 92 Imaging: No results found.  Speciality Comments: No specialty comments available.    Procedures:  No procedures performed Allergies: Rondec [chlorpheniramine-pseudoeph]   Assessment / Plan:     Visit Diagnoses: No diagnosis found.    Orders: No orders of the defined types were placed in this encounter.  No orders of the defined types were placed in this encounter.   Face-to-face time spent with patient was *** minutes. 50% of time was spent in counseling and  coordination of care.  Follow-Up Instructions: No Follow-up on file.   Pollyann Savoy, MD  Note - This record has been created using Animal nutritionist.  Chart creation errors have been sought, but may not always  have been located. Such creation errors do not reflect on  the standard of medical care.

## 2017-01-22 ENCOUNTER — Telehealth: Payer: Self-pay | Admitting: Rheumatology

## 2017-01-22 ENCOUNTER — Ambulatory Visit: Payer: Self-pay | Admitting: Rheumatology

## 2017-01-22 NOTE — Telephone Encounter (Signed)
Patient canceled appt for this pm stating she could not get out of work. This was patients npt fu appt. That she had already scheduled two months out instead of one month. Patient was offered appt for tomorrow, but could not do that either. Next available February which I advised patient was to far out for an appt that is suppose to be one month from first initial appt. Please advise.

## 2017-01-23 NOTE — Telephone Encounter (Signed)
Her repeat CK was normal. She can follow up with the PCP in the meantime we will see her back in February.

## 2017-01-23 NOTE — Telephone Encounter (Signed)
Ok for her to be seen next available/ or she can just follow up with her primary care, did  advise repeat CK was normal per Dr Corliss Skainseveshwar. She has complaints of facial numbness and I advised her to discuss this with her primary care immediately regarding. IF this worsens go to emergency room. Patient voiced understanding. She will follow up with her primary care.

## 2017-04-02 ENCOUNTER — Telehealth: Payer: Self-pay | Admitting: Rheumatology

## 2017-04-02 NOTE — Telephone Encounter (Signed)
[facility] requesting a refill on patients Vit D 5000iu. Please advise.

## 2017-04-03 NOTE — Telephone Encounter (Signed)
Left message for patient to advise she will need labs before we can fill prescription.

## 2017-04-23 ENCOUNTER — Ambulatory Visit: Payer: 59 | Admitting: Neurology

## 2017-06-11 ENCOUNTER — Encounter: Payer: Self-pay | Admitting: Neurology

## 2017-06-11 ENCOUNTER — Other Ambulatory Visit: Payer: Self-pay | Admitting: Neurology

## 2017-06-11 ENCOUNTER — Ambulatory Visit: Payer: Managed Care, Other (non HMO) | Admitting: Neurology

## 2017-06-11 VITALS — BP 104/74 | HR 80 | Ht 64.0 in | Wt 217.5 lb

## 2017-06-11 DIAGNOSIS — R202 Paresthesia of skin: Secondary | ICD-10-CM | POA: Diagnosis not present

## 2017-06-11 DIAGNOSIS — G43109 Migraine with aura, not intractable, without status migrainosus: Secondary | ICD-10-CM | POA: Diagnosis not present

## 2017-06-11 DIAGNOSIS — R252 Cramp and spasm: Secondary | ICD-10-CM

## 2017-06-11 NOTE — Progress Notes (Signed)
Follow-up Visit   Date: 06/11/17    Elizabeth Stevenson MRN: [id number] DOB: [birth year]   Interim History: Elizabeth Malessmeona K Mineer is a 32 y.o. right-handed African American female with migraines and GERD returning to the clinic for follow-up Bilateral hand paresthesias and new complaints of cramps.  The patient was accompanied to the clinic by self.  History of present illness: Starting around 2013, she noticed bilateral hand numbness and tingling sensation over the left forearm and upper arm. Symptoms can occur at rest and worse when she is sleeping. She notices is more when she is waking up and is easily able to shake her hands awake within a few minutes. Symptoms occur about 1-2 times per week. She has difficulty opening tops of bottles only with her right hand. She endorses neck and pain pain.   She complains of left hand tremors, especially when she is drinking extra caffeine or lifting heavy objects. This has occurred only a few times.   She also complains of numbness of the feet and swelling which had been ongoing for the past several years, but recently this has not been problematic.   Migraines started in 2011. Pain is located over the left frontal region, throbbing pain. Pain lasts all day, occuring about 11 times per month. She takes Aleve which does not help. Triggers include certain smells. She has occasional photophobia, phonophobia, n/v. She has imitrex but does not like the way it makes her feel.   UPDATE 12/09/2014:  Last week, she reports having numbness over the upper check and temporal region with pressure over the head.  This was followed by a migraine.  She used Relpax which helped her migraines and she tolerated it well without any side effects.  She has only had to use it three times since May.  She was concerned about the numbness because it has been a long time since she experienced this with a migraine.  There was no associated facial weakness, vision  changes, or weakness.    She continues to have bilateral arm numbness but accidentally missed her EMG appointment.   UPDATE 09/22/2015:  She was seen in the emergency department on 4/24 for atypical chest pain, anxiety, and severe migraine which responded to migraine cocktail.  She reports waking up several nights in a raw with shortness of breath and severe anxiety. She felt as if she was having a panic attack, similar to what she has previously experienced in 2008.  Her anxiety level is always high and she feels that anything stressful can make her more anxious.  In late April had moved to a different apartment and states that she may have triggered her anxiety and headache.   Overall, her migraines are well controlled, occuring about once per month.  She also complains of pressure over the left side of the head which is intermittent, occuring every few weeks.  It is always more noticeable at night.  There is no severe pain, but the discomfort is worrisome to her.  She takes Aleve about 1-2 times per week which usually helps.   UPDATE 06/11/2017:  Patient was last seen in 2017 and is here with complaints of worsening hand numbness and cramps.  Her migraines have improved significantly and now only gets them about once every few months.  She was using relpax, which helped but does not have this anymore.  She is having worsening tingling of the arms and hands, which is occurring several times per week.  She sometimes  wakes up with her arms falling asleep.  She denies any specific weakness, but has difficulty with buttons.  She also complains of cramps involving the legs, worse at night. Cramps can occur in the arms with holding her iPhone or in her neck and legs. There is no specific triggers such as activity.    Medications:  Current Outpatient Medications on File Prior to Visit  Medication Sig Dispense Refill  . desloratadine (CLARINEX) 5 MG tablet TK 1 T PO QD  1  . eletriptan (RELPAX) 40 MG tablet  Take 1 tablet (40 mg total) by mouth as needed for migraine or headache. May repeat in 2 hours if headache persists or recurs. 10 tablet 3  . LORYNA 3-0.02 MG tablet TK 1 T PO QD  1  . meloxicam (MOBIC) 7.5 MG tablet Take 1 tablet (7.5 mg total) by mouth daily. 30 tablet 0  . methocarbamol (ROBAXIN) 500 MG tablet Take 1 tablet (500 mg total) by mouth 2 (two) times daily. (Patient taking differently: Take 500 mg by mouth as needed. ) 20 tablet 0  . Vitamin D, Ergocalciferol, (DRISDOL) 50000 units CAPS capsule Take 1 capsule (50,000 Units total) by mouth 2 (two) times a week. (Patient not taking: Reported on 06/11/2017) 24 capsule 0   No current facility-administered medications on file prior to visit.     Allergies:  Allergies  Allergen Reactions  . Rondec [Chlorpheniramine-Pseudoeph] Other (See Comments)    Hallucinations.  Not sure which formulation of rondec (brompheniramine vs. Chlorpheniramine)    Review of Systems:  CONSTITUTIONAL: No fevers, chills, night sweats, or weight loss.  EYES: No visual changes or eye pain ENT: No hearing changes.  No history of nose bleeds.   RESPIRATORY: No cough, wheezing and shortness of breath.   CARDIOVASCULAR: Negative for chest pain, and palpitations.   GI: Negative for abdominal discomfort, blood in stools or black stools.  No recent change in bowel habits.   GU:  No history of incontinence.   MUSCLOSKELETAL: No history of joint pain or swelling.  No myalgias.   SKIN: Negative for lesions, rash, and itching.   ENDOCRINE: Negative for cold or heat intolerance, polydipsia or goiter.   PSYCH:  No depression + anxiety symptoms.   NEURO: As Above.   Vital Signs:  BP 104/74   Pulse 80   Ht 5\' 4"  (1.626 m)   Wt 217 lb 8 oz (98.7 kg)   BMI 37.33 kg/m   General Medical Exam:   General:  Well appearing, comfortable  Eyes/ENT: see cranial nerve examination.   Neck: No masses appreciated.  Full range of motion without tenderness.  No carotid  bruits. Respiratory:  Clear to auscultation, good air entry bilaterally.   Cardiac:  Regular rate and rhythm, no murmur.   Ext:  No edema or deformity  Neurological Exam: MENTAL STATUS including orientation to time, place, person, recent and remote memory, attention span and concentration, language, and fund of knowledge is normal.  Speech is not dysarthric.  CRANIAL NERVES: No visual field defects. Pupils equal round and reactive to light.  Normal conjugate, extra-ocular eye movements in all directions of gaze.  No ptosis.  There is no eyelid myotonia.  Face is symmetric. Palate elevates symmetrically.  Tongue is midline.  MOTOR:  Motor strength is 5/5 in all extremities.  No atrophy, fasciculations or abnormal movements. There is no grip or percussion myotonia. No pronator drift.  Tone is normal.    MSRs:  Reflexes are 2+/4 throughout.  SENSORY:  Intact to vibration and temperature.  Tinel's is mildly positive on the right.  COORDINATION/GAIT:  Normal finger-to- nose-finger and heel-to-shin.  Intact rapid alternating movements bilaterally.  Gait narrow based and stable.    IMPRESSION/PLAN: 1.  Bilateral hand paresthesias, ?entrapment neuropathy  - NCS/EMG of the arms  2.  Cramps in the setting of previously elevated CK. Most recent CK has been normal and she has previously been evaluated by rheumatology for this (Dr. Corliss Skains).  Exam does not show signs of weakness, atrophy, or myotonia.  - Check CK, vitamin B12, vitamin D, CMP, PTH  - EMG will assess for any underlying muscle pathology  3.  Episodic migraine with aura and left facial paresthesias  - Continue Relpax 40mg  daily as needed  4.  Chronic daily headaches - resolved  5.  Anxiety, noncompliant with zoloft, prescribed by PCP  Further recommendations will be based on the results of her testing  Greater than 50% of this 30 minute visit was spent in counseling, explanation of diagnosis, planning of further management, and  coordination of care.   Thank you for allowing me to participate in patient's care.  If I can answer any additional questions, I would be pleased to do so.    Sincerely,    Donika K. Allena Katz, DO

## 2017-06-11 NOTE — Patient Instructions (Signed)
Check labs  ELECTROMYOGRAM AND NERVE CONDUCTION STUDIES (EMG/NCS) INSTRUCTIONS  How to Prepare The neurologist conducting the EMG will need to know if you have certain medical conditions. Tell the neurologist and other EMG lab personnel if you: Have a pacemaker or any other electrical medical device Take blood-thinning medications Have hemophilia, a blood-clotting disorder that causes prolonged bleeding Bathing Take a shower or bath shortly before your exam in order to remove oils from your skin. Don't apply lotions or creams before the exam.  What to Expect You'll likely be asked to change into a hospital gown for the procedure and lie down on an examination table. The following explanations can help you understand what will happen during the exam.  Electrodes. The neurologist or a technician places surface electrodes at various locations on your skin depending on where you're experiencing symptoms. Or the neurologist may insert needle electrodes at different sites depending on your symptoms.  Sensations. The electrodes will at times transmit a tiny electrical current that you may feel as a twinge or spasm. The needle electrode may cause discomfort or pain that usually ends shortly after the needle is removed. If you are concerned about discomfort or pain, you may want to talk to the neurologist about taking a short break during the exam.  Instructions. During the needle EMG, the neurologist will assess whether there is any spontaneous electrical activity when the muscle is at rest - activity that isn't present in healthy muscle tissue - and the degree of activity when you slightly contract the muscle.  He or she will give you instructions on resting and contracting a muscle at appropriate times. Depending on what muscles and nerves the neurologist is examining, he or she may ask you to change positions during the exam.  After your EMG You may experience some temporary, minor bruising where the  needle electrode was inserted into your muscle. This bruising should fade within several days. If it persists, contact your primary care doctor.   

## 2017-06-12 LAB — VITAMIN B12: VITAMIN B 12: 383 pg/mL (ref 232–1245)

## 2017-06-12 LAB — COMPREHENSIVE METABOLIC PANEL
ALBUMIN: 4.4 g/dL (ref 3.5–5.5)
ALT: 9 IU/L (ref 0–32)
AST: 13 IU/L (ref 0–40)
Albumin/Globulin Ratio: 1.5 (ref 1.2–2.2)
Alkaline Phosphatase: 64 IU/L (ref 39–117)
BILIRUBIN TOTAL: 0.2 mg/dL (ref 0.0–1.2)
BUN / CREAT RATIO: 14 (ref 9–23)
BUN: 14 mg/dL (ref 6–20)
CALCIUM: 9.5 mg/dL (ref 8.7–10.2)
CHLORIDE: 102 mmol/L (ref 96–106)
CO2: 22 mmol/L (ref 20–29)
CREATININE: 1 mg/dL (ref 0.57–1.00)
GFR calc Af Amer: 88 mL/min/{1.73_m2} (ref >59)
GFR calc non Af Amer: 76 mL/min/{1.73_m2} (ref >59)
GLUCOSE: 83 mg/dL (ref 65–99)
Globulin, Total: 3 g/dL (ref 1.5–4.5)
Potassium: 4.3 mmol/L (ref 3.5–5.2)
Sodium: 139 mmol/L (ref 134–144)
TOTAL PROTEIN: 7.4 g/dL (ref 6.0–8.5)

## 2017-06-12 LAB — CK: Total CK: 74 U/L (ref 24–173)

## 2017-06-12 LAB — VITAMIN D 25 HYDROXY (VIT D DEFICIENCY, FRACTURES): Vit D, 25-Hydroxy: 37.3 ng/mL (ref 30.0–100.0)

## 2017-06-12 LAB — PARATHYROID HORMONE, INTACT (NO CA): PTH: 33 pg/mL (ref 15–65)

## 2017-06-30 ENCOUNTER — Emergency Department (HOSPITAL_COMMUNITY)
Admission: EM | Admit: 2017-06-30 | Discharge: 2017-06-30 | Disposition: A | Discharge status: Home / Self Care | Payer: Managed Care, Other (non HMO) | Attending: Emergency Medicine | Admitting: Emergency Medicine

## 2017-06-30 ENCOUNTER — Encounter (HOSPITAL_COMMUNITY): Payer: Self-pay

## 2017-06-30 ENCOUNTER — Other Ambulatory Visit: Payer: Self-pay

## 2017-06-30 ENCOUNTER — Emergency Department (HOSPITAL_COMMUNITY): Payer: Managed Care, Other (non HMO)

## 2017-06-30 DIAGNOSIS — Z79899 Other long term (current) drug therapy: Secondary | ICD-10-CM | POA: Insufficient documentation

## 2017-06-30 DIAGNOSIS — Z87891 Personal history of nicotine dependence: Secondary | ICD-10-CM | POA: Insufficient documentation

## 2017-06-30 DIAGNOSIS — K529 Noninfective gastroenteritis and colitis, unspecified: Secondary | ICD-10-CM | POA: Insufficient documentation

## 2017-06-30 DIAGNOSIS — K6389 Other specified diseases of intestine: Secondary | ICD-10-CM

## 2017-06-30 DIAGNOSIS — R1031 Right lower quadrant pain: Secondary | ICD-10-CM | POA: Diagnosis present

## 2017-06-30 LAB — CBC
HCT: 37.1 % (ref 36.0–46.0)
HEMOGLOBIN: 12.1 g/dL (ref 12.0–15.0)
MCH: 30.2 pg (ref 26.0–34.0)
MCHC: 32.6 g/dL (ref 30.0–36.0)
MCV: 92.5 fL (ref 78.0–100.0)
Platelets: 307 10*3/uL (ref 150–400)
RBC: 4.01 MIL/uL (ref 3.87–5.11)
RDW: 13.4 % (ref 11.5–15.5)
WBC: 7.7 10*3/uL (ref 4.0–10.5)

## 2017-06-30 LAB — COMPREHENSIVE METABOLIC PANEL
ALK PHOS: 57 U/L (ref 38–126)
ALT: 10 U/L — ABNORMAL LOW (ref 14–54)
ANION GAP: 8 (ref 5–15)
AST: 17 U/L (ref 15–41)
Albumin: 3.4 g/dL — ABNORMAL LOW (ref 3.5–5.0)
BUN: 7 mg/dL (ref 6–20)
CALCIUM: 9.1 mg/dL (ref 8.9–10.3)
CO2: 22 mmol/L (ref 22–32)
Chloride: 107 mmol/L (ref 101–111)
Creatinine, Ser: 0.96 mg/dL (ref 0.44–1.00)
Glucose, Bld: 85 mg/dL (ref 65–99)
Potassium: 3.7 mmol/L (ref 3.5–5.1)
Sodium: 137 mmol/L (ref 135–145)
TOTAL PROTEIN: 6.8 g/dL (ref 6.5–8.1)
Total Bilirubin: 0.6 mg/dL (ref 0.3–1.2)

## 2017-06-30 LAB — I-STAT BETA HCG BLOOD, ED (MC, WL, AP ONLY)

## 2017-06-30 LAB — URINALYSIS, ROUTINE W REFLEX MICROSCOPIC
BILIRUBIN URINE: NEGATIVE
Glucose, UA: NEGATIVE mg/dL
Hgb urine dipstick: NEGATIVE
Ketones, ur: NEGATIVE mg/dL
LEUKOCYTES UA: NEGATIVE
NITRITE: NEGATIVE
Protein, ur: NEGATIVE mg/dL
SPECIFIC GRAVITY, URINE: 1.011 (ref 1.005–1.030)
pH: 6 (ref 5.0–8.0)

## 2017-06-30 LAB — LIPASE, BLOOD: Lipase: 27 U/L (ref 11–51)

## 2017-06-30 MED ORDER — KETOROLAC TROMETHAMINE 15 MG/ML IJ SOLN
15.0000 mg | Freq: Once | INTRAMUSCULAR | Status: AC
  Administered 2017-06-30: 15 mg via INTRAVENOUS
  Filled 2017-06-30: qty 1

## 2017-06-30 MED ORDER — IOPAMIDOL (ISOVUE-300) INJECTION 61%
INTRAVENOUS | Status: AC
  Administered 2017-06-30: 100 mL
  Filled 2017-06-30: qty 100

## 2017-06-30 MED ORDER — IBUPROFEN 600 MG PO TABS: 600.0000 mg | ORAL_TABLET | Freq: Four times a day (QID) | ORAL | 0 refills | Status: DC | PRN

## 2017-06-30 MED ORDER — ONDANSETRON 4 MG PO TBDP
4.0000 mg | ORAL_TABLET | Freq: Three times a day (TID) | ORAL | 0 refills | Status: DC | PRN
Start: 2017-06-30 — End: 2018-07-21

## 2017-06-30 MED ORDER — HYDROMORPHONE HCL 1 MG/ML IJ SOLN
0.5000 mg | Freq: Once | INTRAMUSCULAR | Status: AC
  Administered 2017-06-30: 0.5 mg via INTRAVENOUS
  Filled 2017-06-30: qty 1

## 2017-06-30 MED ORDER — ONDANSETRON HCL 4 MG/2ML IJ SOLN
4.0000 mg | Freq: Once | INTRAMUSCULAR | Status: AC
  Administered 2017-06-30: 4 mg via INTRAVENOUS
  Filled 2017-06-30: qty 2

## 2017-06-30 MED ORDER — ONDANSETRON 4 MG PO TBDP: 4.0000 mg | ORAL_TABLET | Freq: Three times a day (TID) | ORAL | 0 refills | Status: DC | PRN

## 2017-06-30 NOTE — Discharge Instructions (Signed)
The CT scan shows epiploic appendage otitis.  This is an inflammatory condition of your colon, which is mostly self-limiting. Take the anti-inflammatories and consider warm compresses. See the general surgeon in 1 week if you are not getting better Return to the emergency room immediately if you start having severe nausea with vomiting, bloody stools, severe pains, fevers.

## 2017-06-30 NOTE — ED Triage Notes (Signed)
Pt reports RLQ pain that started last night. Pt states the pain is worse with sitting or standing. She also reports diarrhea. Pt states LMP was earlier this month but unsure of exact date.

## 2017-06-30 NOTE — ED Provider Notes (Addendum)
[facility] Provider Note   CSN: [id number] Arrival date & time: 06/30/17  1708     History   Chief Complaint Chief Complaint  Patient presents with  . Abdominal Pain    HPI Elizabeth Stevenson is a 32 y.o. female.  HPI  32 year old female with history of pelvic pain, IBS, ovarian cyst, STD comes in with chief complaint of right lower quadrant abdominal pain.  Patient's pain started last night, and since then it has been constant.  Her pain is progressed throughout the day today.  Pain is sharp in nature, nonradiating and there is no associated vaginal discharge, vaginal bleeding, UTI-like symptoms, bloody stools.  Patient is not pregnant as far she knows.  Past Medical History:  Diagnosis Date  . Abnormal Pap smear   . Chlamydia   . GERD (gastroesophageal reflux disease)   . HSV-1 (herpes simplex virus 1) infection   . HSV-2 (herpes simplex virus 2) infection   . Migraine   . Ovarian cyst   . Pelvic pain   . Ventricular tachycardia [facility](HCC)     Patient Active Problem List   Diagnosis Date Noted  . Elevated CK 11/29/2016  . Chronic daily headache 09/22/2015  . Tobacco use disorder 03/23/2015  . Tonsil symptom 03/23/2015  . Routine general medical examination at a health care facility 03/23/2015  . Back pain 03/23/2015  . Numbness 08/28/2014  . Viral illness 08/28/2014  . Morbid obesity (HCC) 02/19/2014  . Leg skin lesion, left 01/21/2014  . Migraine 02/25/2013  . Allergic rhinitis 02/25/2013  . HSV-1 (herpes simplex virus 1) infection     Past Surgical History:  Procedure Laterality Date  . COLPOSCOPY  2008   has had procedure done couple times  . MOUTH SURGERY      OB History    Gravida Para Term Preterm AB Living   3 1 1  0 2 1   SAB TAB Ectopic Multiple Live Births   1 1 0 0         Home Medications    Prior to Admission medications   Medication Sig Start Date End Date Taking? Authorizing Provider    desloratadine (CLARINEX) 5 MG tablet TK 1 T PO QD 09/08/15  Yes [provider]  eletriptan (RELPAX) 40 MG tablet Take 1 tablet (40 mg total) by mouth as needed for migraine or headache. May repeat in 2 hours if headache persists or recurs. 10/01/14  Yes Patel, Donika K, DO  LORYNA 3-0.02 MG tablet TK 1 T PO QD 05/15/17  Yes [provider]  meloxicam (MOBIC) 7.5 MG tablet Take 1 tablet (7.5 mg total) by mouth daily. 01/09/17  Yes Hedges, Tinnie GensJeffrey, PA-C  methocarbamol (ROBAXIN) 500 MG tablet Take 1 tablet (500 mg total) by mouth 2 (two) times daily. Patient taking differently: Take 500 mg by mouth as needed.  01/09/17  Yes Hedges, Tinnie GensJeffrey, PA-C  Vitamin D, Ergocalciferol, (DRISDOL) 50000 units CAPS capsule Take 1 capsule (50,000 Units total) by mouth 2 (two) times a week. 12/13/16  Yes Deveshwar, Janalyn RouseShaili, MD  ibuprofen (ADVIL,MOTRIN) 600 MG tablet Take 1 tablet (600 mg total) by mouth every 6 (six) hours as needed. 06/30/17   Derwood KaplanNanavati, Jerney Baksh, MD  ibuprofen (ADVIL,MOTRIN) 600 MG tablet Take 1 tablet (600 mg total) by mouth every 6 (six) hours as needed. 06/30/17   Derwood KaplanNanavati, Porsche Noguchi, MD  ondansetron (ZOFRAN ODT) 4 MG disintegrating tablet Take 1 tablet (4 mg total) by mouth every 8 (eight) hours  as needed for nausea or vomiting. 06/30/17   Derwood Kaplan, MD  ondansetron (ZOFRAN ODT) 4 MG disintegrating tablet Take 1 tablet (4 mg total) by mouth every 8 (eight) hours as needed for nausea or vomiting. 06/30/17   Derwood Kaplan, MD    Family History Family History  Problem Relation Age of Onset  . Hypertension Mother        Living  . Hyperlipidemia Father   . Hypertension Father   . Hypertension Maternal Grandmother   . Stroke Maternal Grandmother   . Diabetes Maternal Grandfather   . Diabetes Paternal Grandmother   . Healthy Daughter     Social History Social History   Tobacco Use  . Smoking status: Former Smoker    Types: Cigars    Last attempt to quit: 02/11/2014    Years since  quitting: 3.3  . Smokeless tobacco: Never Used  . Tobacco comment: quit last week  Substance Use Topics  . Alcohol use: Yes    Alcohol/week: 0.0 oz    Comment: 1 glass of wine, 2-3 times week  . Drug use: No     Allergies   Rondec [chlorpheniramine-pseudoeph]   Review of Systems Review of Systems  Constitutional: Positive for activity change.  Gastrointestinal: Positive for abdominal pain.  Genitourinary: Negative for dysuria, vaginal discharge and vaginal pain.  Allergic/Immunologic: Negative for immunocompromised state.  All other systems reviewed and are negative.    Physical Exam Updated Vital Signs BP 93/82   Pulse 72   Temp 99 F (37.2 C) (Oral)   Resp 18   SpO2 100%   Physical Exam  Constitutional: She is oriented to person, place, and time. She appears well-developed.  HENT:  Head: Normocephalic and atraumatic.  Eyes: EOM are normal.  Neck: Normal range of motion. Neck supple.  Cardiovascular: Normal rate.  Pulmonary/Chest: Effort normal.  Abdominal: Bowel sounds are normal. There is tenderness in the right lower quadrant. There is guarding. There is no rebound and no tenderness at McBurney's point.  Neurological: She is alert and oriented to person, place, and time.  Skin: Skin is warm and dry.  Nursing note and vitals reviewed.    ED Treatments / Results  Labs (all labs ordered are listed, but only abnormal results are displayed) Labs Reviewed  COMPREHENSIVE METABOLIC PANEL - Abnormal; Notable for the following components:      Result Value   Albumin 3.4 (*)    ALT 10 (*)    All other components within normal limits  LIPASE, BLOOD  CBC  URINALYSIS, ROUTINE W REFLEX MICROSCOPIC  I-STAT BETA HCG BLOOD, ED ([facility], [facility], [facility] ONLY)    EKG  EKG Interpretation None       Radiology Ct Abdomen Pelvis W Contrast  Result Date: 06/30/2017 CLINICAL DATA:  Right lower quadrant pain EXAM: CT ABDOMEN AND PELVIS WITH CONTRAST TECHNIQUE: Multidetector CT  imaging of the abdomen and pelvis was performed using the standard protocol following bolus administration of intravenous contrast. CONTRAST:  ISOVUE-300 IOPAMIDOL (ISOVUE-300) INJECTION 61% COMPARISON:  09/19/2016 FINDINGS: Lower chest: No acute abnormality. Hepatobiliary: Focal fat deposition near the falciform ligament. No calcified gallstones or biliary dilatation Pancreas: Unremarkable. No pancreatic ductal dilatation or surrounding inflammatory changes. Spleen: Normal in size without focal abnormality. Adrenals/Urinary Tract: Adrenal glands are unremarkable. Kidneys are normal, without renal calculi, focal lesion, or hydronephrosis. Bladder is unremarkable. Stomach/Bowel: The stomach is nonenlarged. No dilated small bowel. Appendix normal. Mild soft tissue stranding in the right lower quadrant adjacent to  the ascending colon with some asymmetric wall thickening here, for example series 3, image number 55. No extraluminal gas. Vascular/Lymphatic: No significant vascular findings are present. No enlarged abdominal or pelvic lymph nodes. Reproductive: Uterus and bilateral adnexa are unremarkable. Other: Trace free fluid in the pelvis. Negative for free air. Fat in the umbilical region Musculoskeletal: No acute or significant osseous findings. IMPRESSION: 1. Negative for acute appendicitis. 2. Small fluid and soft tissue thickening in the right lower quadrant adjacent to a segment of ascending colon which demonstrates mild asymmetric wall thickening, could consider a mild focal colitis of infectious or inflammatory etiology, or slightly atypical appearance of epiploic appendagitis. Negative for free air. 3. Trace free fluid in the pelvis. Electronically Signed   By: Jasmine Pang M.D.   On: 06/30/2017 20:01    Procedures Procedures (including critical care time)  Medications Ordered in ED Medications  HYDROmorphone (DILAUDID) injection 0.5 mg (0.5 mg Intravenous Given 06/30/17 1929)  ondansetron  (ZOFRAN) injection 4 mg (4 mg Intravenous Given 06/30/17 1929)  iopamidol (ISOVUE-300) 61 % injection (100 mLs  Contrast Given 06/30/17 1903)  ketorolac (TORADOL) 15 MG/ML injection 15 mg (15 mg Intravenous Given 06/30/17 2041)     Initial Impression / Assessment and Plan / ED Course  I have reviewed the triage vital signs and the nursing notes.  Pertinent labs & imaging results that were available during my care of the patient were reviewed by me and considered in my medical decision making (see chart for details).  Clinical Course as of Jun 30 2129  Wynelle Link Jun 30, 2017  2105 Results from the ER workup discussed with the patient face to face and all questions answered to the best of my ability.  Patient advised to follow-up with the general surgeons.  She has been informed that she needs to return to the ER if she has worsening of her symptoms.  Based on the CT scan, concerns for appendicitis effectively ruled out.  Additionally there is no comment on any pathology around the ovaries, therefore we do not think patient is having a torsion. Patient's labs including CBC, renal function, urine analysis and her vital signs have been within normal limits which is also reassuring.Marland Kitchen CT ABDOMEN PELVIS W CONTRAST [AN]    Clinical Course User Index [AN] Derwood Kaplan, MD    32 year old comes in with chief complaint of right lower quadrant abdominal pain.  Patient's pain has been constant since last night, and it has been focal in the right lower quadrant only.  Patient does not have any concerning constitutional's associated with that.  Particularly patient does not have any UTI-like symptoms, vaginal discharge or bleeding, fevers or chills, bloody stools.  CT scan ordered.  Differential diagnosis mainly includes ovarian cyst, followed by appendicitis and torsion.   Final Clinical Impressions(s) / ED Diagnoses   Final diagnoses:  Epiploic appendagitis    ED Discharge Orders        Ordered     ibuprofen (ADVIL,MOTRIN) 600 MG tablet  Every 6 hours PRN     06/30/17 2056    ondansetron (ZOFRAN ODT) 4 MG disintegrating tablet  Every 8 hours PRN     06/30/17 2056    ibuprofen (ADVIL,MOTRIN) 600 MG tablet  Every 6 hours PRN     06/30/17 2057    ondansetron (ZOFRAN ODT) 4 MG disintegrating tablet  Every 8 hours PRN     06/30/17 2057       Derwood Kaplan, MD 06/30/17 2107  Derwood Kaplan, MD 06/30/17 2131

## 2017-06-30 NOTE — ED Provider Notes (Signed)
Patient placed in Quick Look pathway, seen and evaluated   Chief Complaint: right lower abdominal pain  HPI:   Pt complains of pain with moving, Pain in right lower abdomen started last night.  Pt has had several episodes of diarrhea  ROS: Review of Systems  Constitutional: Negative for fever.  Respiratory: Negative for cough.   Cardiovascular: Negative for chest pain.  Gastrointestinal: Positive for abdominal pain and diarrhea.  Genitourinary: Negative for dysuria.  Neurological: Negative for headaches.  All other systems reviewed and are negative.   Physical Exam:   Gen: No distress  Neuro: Awake and Alert  Skin: Warm    Focused Exam:  Lungs clear, heart rr, abd tender right lower abdomen  Initiation of care has begun. The patient has been counseled on the process, plan, and necessity for staying for the completion/evaluation, and the remainder of the medical screening examination   Osie CheeksSofia, Leslie K, PA-C 06/30/17 1743    Benjiman CorePickering, Nathan, MD 06/30/17 2238

## 2017-06-30 NOTE — ED Notes (Signed)
Pt in CT.

## 2017-07-02 ENCOUNTER — Encounter: Payer: Managed Care, Other (non HMO) | Admitting: Neurology

## 2018-07-18 NOTE — Progress Notes (Signed)
Follow-up Visit   Date: 07/21/18    Elizabeth Stevenson MRN: [id number] DOB: 23-Nov-1985   Interim History: Elizabeth Stevenson is a 33 y.o. right-handed African American female with migraines and GERD returning to the clinic for follow-up Bilateral hand paresthesias and new complaints of cramps.  The patient was accompanied to the clinic by self.  History of present illness: Starting around 2013, she noticed bilateral hand numbness and tingling sensation over the left forearm and upper arm. Symptoms can occur at rest and worse when she is sleeping. She notices is more when she is waking up and is easily able to shake her hands awake within a few minutes. Symptoms occur about 1-2 times per week. She has difficulty opening tops of bottles only with her right hand. She endorses neck and pain pain.    Migraines started in 2011. Pain is located over the left frontal region, throbbing pain. Pain lasts all day, occuring about 11 times per month. She takes Aleve which does not help. Triggers include certain smells. She has occasional photophobia, phonophobia, n/v. She has imitrex but does not like the way it makes her feel.   She continues to have bilateral arm numbness but accidentally missed her EMG appointment.   UPDATE 09/22/2015:  She was seen in the emergency department on 4/24 for atypical chest pain, anxiety, and severe migraine which responded to migraine cocktail.  She reports waking up several nights in a raw with shortness of breath and severe anxiety. She felt as if she was having a panic attack, similar to what she has previously experienced in 2008.  Her anxiety level is always high and she feels that anything stressful can make her more anxious.  In late April had moved to a different apartment and states that she may have triggered her anxiety and headache.   UPDATE 06/11/2017:  Patient was last seen in 2017 and is here with complaints of worsening hand numbness and  cramps.  Her migraines have improved significantly and now only gets them about once every few months.  She was using relpax, which helped but does not have this anymore.  She is having worsening tingling of the arms and hands, which is occurring several times per week.  She sometimes wakes up with her arms falling asleep.  She denies any specific weakness, but has difficulty with buttons.  She also complains of cramps involving the legs, worse at night. Cramps can occur in the arms with holding her iPhone or in her neck and legs. There is no specific triggers such as activity.    UPDATE 07/18/2018:  Over the past few months, she felt that her right eyelid was heavy and the right side of her lip was droopy.  She went to see her eye doctor who did not find any problems and and recommended she see me for concern of Bell's palsy. Patient reports that her droopiness is intermittent and there is no pattern.  There is no complete eye closure, double vision, slurred speech, difficulty swallowing or limb weakness.  Her last migraine was in February and overall is very well-controlled, occurring about 2-4 times per year and responsive to Relpax.  She continues to have bilateral arm numbness and did not schedule prior EMG.  She does not want to pursue this now because of lack of insurance.  Medications:  Current Outpatient Medications on File Prior to Visit  Medication Sig Dispense Refill  . ibuprofen (ADVIL,MOTRIN) 600 MG tablet Take 1 tablet (  600 mg total) by mouth every 6 (six) hours as needed. 30 tablet 0  . meloxicam (MOBIC) 7.5 MG tablet Take 1 tablet (7.5 mg total) by mouth daily. 30 tablet 0  . methocarbamol (ROBAXIN) 500 MG tablet Take 1 tablet (500 mg total) by mouth 2 (two) times daily. (Patient taking differently: Take 500 mg by mouth as needed. ) 20 tablet 0   No current facility-administered medications on file prior to visit.     Allergies:  Allergies  Allergen Reactions  . Rondec  [Chlorpheniramine-Pseudoeph] Other (See Comments)    Hallucinations.  Not sure which formulation of rondec (brompheniramine vs. Chlorpheniramine)    Review of Systems:  CONSTITUTIONAL: No fevers, chills, night sweats, or weight loss.  EYES: No visual changes or eye pain ENT: No hearing changes.  No history of nose bleeds.   RESPIRATORY: No cough, wheezing and shortness of breath.   CARDIOVASCULAR: Negative for chest pain, and palpitations.   GI: Negative for abdominal discomfort, blood in stools or black stools.  No recent change in bowel habits.   GU:  No history of incontinence.   MUSCLOSKELETAL: No history of joint pain or swelling.  No myalgias.   SKIN: Negative for lesions, rash, and itching.   ENDOCRINE: Negative for cold or heat intolerance, polydipsia or goiter.   PSYCH:  No depression + anxiety symptoms.   NEURO: As Above.   Vital Signs:  BP 110/80   Pulse 73   Ht 5\' 4"  (1.626 m)   Wt 235 lb 6 oz (106.8 kg)   SpO2 94%   BMI 40.40 kg/m   General Medical Exam:   General:  Well appearing, comfortable  Eyes/ENT: see cranial nerve examination.   Neck: No masses appreciated.  Full range of motion without tenderness.  No carotid bruits. Respiratory:  Clear to auscultation, good air entry bilaterally.   Cardiac:  Regular rate and rhythm, no murmur.   Ext:  No edema or deformity  Neurological Exam: MENTAL STATUS including orientation to time, place, person, recent and remote memory, attention span and concentration, language, and fund of knowledge is normal.  Speech is not dysarthric.  CRANIAL NERVES: No visual field defects. Pupils equal round and reactive to light.  Normal conjugate, extra-ocular eye movements in all directions of gaze.  No ptosis.  There is no eyelid myotonia.  Face is symmetric. Facial muscles are 5/5.  Palate elevates symmetrically.  Tongue is midline.  MOTOR:  Motor strength is 5/5 in all extremities.  No pronator drift.  Tone is normal.    MSRs:   Reflexes are 2+/4 throughout.  SENSORY:  Intact to vibration and temperature.   COORDINATION/GAIT:  Normal finger-to- nose-finger.  Gait narrow based and stable. Tandem gait intact  DATA: Lab Results  Component Value Date   VITAMINB12 383 06/11/2017   Lab Results  Component Value Date   CKTOTAL 74 06/11/2017     IMPRESSION/PLAN: 1.  Subjective sensation of facial asymmetry and weakness which is not appreciated on neurological exam. Patient reassured that I do not see anything worrisome.  Patient education that Bell's palsy does not manifest with bilateral face symptoms.  No signs of neuromuscular junction disorder such as myasthenia.  2.  Migraine with aura and left facial paresthesias, very well-controlled with migraine occurring 2-4 times per year and responsive to Replax.  Refill provided.  3.  Bilateral hand paresthesias, ?entrapment neuropathy.  She will call to schedule NCS/EMG once she has insurance.    Thank you for allowing  me to participate in patient's care.  If I can answer any additional questions, I would be pleased to do so.    Sincerely,    Najib Colmenares K. Posey Pronto, DO

## 2018-07-21 ENCOUNTER — Ambulatory Visit: Payer: Self-pay | Admitting: Neurology

## 2018-07-21 ENCOUNTER — Encounter: Payer: Self-pay | Admitting: Neurology

## 2018-07-21 VITALS — BP 110/80 | HR 73 | Ht 64.0 in | Wt 235.4 lb

## 2018-07-21 DIAGNOSIS — R202 Paresthesia of skin: Secondary | ICD-10-CM

## 2018-07-21 DIAGNOSIS — G43109 Migraine with aura, not intractable, without status migrainosus: Secondary | ICD-10-CM

## 2018-07-21 MED ORDER — ELETRIPTAN HYDROBROMIDE 40 MG PO TABS: 40.0000 mg | ORAL_TABLET | ORAL | 3 refills | Status: AC | PRN

## 2018-07-21 NOTE — Patient Instructions (Signed)
Call my office when you have insurance to set up nerve testing of both arms  ELECTROMYOGRAM AND NERVE CONDUCTION STUDIES (EMG/NCS) INSTRUCTIONS  How to Prepare The neurologist conducting the EMG will need to know if you have certain medical conditions. Tell the neurologist and other EMG lab personnel if you: . Have a pacemaker or any other electrical medical device . Take blood-thinning medications . Have hemophilia, a blood-clotting disorder that causes prolonged bleeding Bathing Take a shower or bath shortly before your exam in order to remove oils from your skin. Don't apply lotions or creams before the exam.  What to Expect You'll likely be asked to change into a hospital gown for the procedure and lie down on an examination table. The following explanations can help you understand what will happen during the exam.  . Electrodes. The neurologist or a technician places surface electrodes at various locations on your skin depending on where you're experiencing symptoms. Or the neurologist may insert needle electrodes at different sites depending on your symptoms.  . Sensations. The electrodes will at times transmit a tiny electrical current that you may feel as a twinge or spasm. The needle electrode may cause discomfort or pain that usually ends shortly after the needle is removed. If you are concerned about discomfort or pain, you may want to talk to the neurologist about taking a short break during the exam.  . Instructions. During the needle EMG, the neurologist will assess whether there is any spontaneous electrical activity when the muscle is at rest - activity that isn't present in healthy muscle tissue - and the degree of activity when you slightly contract the muscle.  He or she will give you instructions on resting and contracting a muscle at appropriate times. Depending on what muscles and nerves the neurologist is examining, he or she may ask you to change positions during the exam.   After your EMG You may experience some temporary, minor bruising where the needle electrode was inserted into your muscle. This bruising should fade within several days. If it persists, contact your primary care doctor.

## 2018-07-22 ENCOUNTER — Ambulatory Visit (HOSPITAL_COMMUNITY)
Admission: EM | Admit: 2018-07-22 | Discharge: 2018-07-22 | Disposition: A | Discharge status: Home / Self Care | Payer: Self-pay | Attending: Family Medicine | Admitting: Family Medicine

## 2018-07-22 ENCOUNTER — Encounter (HOSPITAL_COMMUNITY): Payer: Self-pay

## 2018-07-22 DIAGNOSIS — S63501A Unspecified sprain of right wrist, initial encounter: Secondary | ICD-10-CM

## 2018-07-22 DIAGNOSIS — S161XXA Strain of muscle, fascia and tendon at neck level, initial encounter: Secondary | ICD-10-CM

## 2018-07-22 DIAGNOSIS — S66911A Strain of unspecified muscle, fascia and tendon at wrist and hand level, right hand, initial encounter: Secondary | ICD-10-CM

## 2018-07-22 MED ORDER — METHOCARBAMOL 500 MG PO TABS: 500.0000 mg | ORAL_TABLET | Freq: Two times a day (BID) | ORAL | 0 refills | Status: AC
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
## 2018-09-19 ENCOUNTER — Ambulatory Visit: Payer: Managed Care, Other (non HMO) | Admitting: Neurology

## 2019-01-23 ENCOUNTER — Other Ambulatory Visit: Payer: Self-pay | Admitting: Gastroenterology

## 2019-01-23 DIAGNOSIS — R1011 Right upper quadrant pain: Secondary | ICD-10-CM

## 2019-03-02 ENCOUNTER — Other Ambulatory Visit: Payer: Self-pay

## 2019-10-11 ENCOUNTER — Other Ambulatory Visit: Payer: Self-pay

## 2019-10-11 ENCOUNTER — Ambulatory Visit
Admission: EM | Admit: 2019-10-11 | Discharge: 2019-10-11 | Disposition: A | Discharge status: Home / Self Care | Payer: No Typology Code available for payment source | Attending: Physician Assistant | Admitting: Physician Assistant

## 2019-10-11 DIAGNOSIS — R05 Cough: Secondary | ICD-10-CM

## 2019-10-11 DIAGNOSIS — R059 Cough, unspecified: Secondary | ICD-10-CM

## 2019-10-11 DIAGNOSIS — R0982 Postnasal drip: Secondary | ICD-10-CM

## 2019-10-11 DIAGNOSIS — J029 Acute pharyngitis, unspecified: Secondary | ICD-10-CM

## 2019-10-11 MED ORDER — IPRATROPIUM BROMIDE 0.06 % NA SOLN: 2.0000 | Freq: Four times a day (QID) | NASAL | 0 refills | Status: DC

## 2019-10-11 MED ORDER — FLUTICASONE PROPIONATE 50 MCG/ACT NA SUSP: 2.0000 | Freq: Every day | NASAL | 0 refills | Status: DC

## 2019-10-11 NOTE — ED Provider Notes (Signed)
[name]    CSN: [id number] Arrival date & time: 10/11/19  1054      History   Chief Complaint Chief Complaint  Patient presents with  . Sore Throat  . Nasal Congestion    HPI Elizabeth Stevenson is a 34 y.o. female.   34 year old female comes in for 3 day of URI symptoms. Cough, sneezing, sore throat, post nasal drainage, rhinorrhea, nasal congestion. Denies fever, chills, body aches. Baseline abdominal issues that is without changes. Denies shortness of breath, loss of taste/smell. Current some day smoker, black and miles.   States had sore throat for 1 day 1 week ago, this completely resolved prior to current symptom onset     Past Medical History:  Diagnosis Date  . Abnormal Pap smear   . Chlamydia   . GERD (gastroesophageal reflux disease)   . HSV-1 (herpes simplex virus 1) infection   . HSV-2 (herpes simplex virus 2) infection   . Migraine   . Ovarian cyst   . Pelvic pain   . Ventricular tachycardia [facility])     Patient Active Problem List   Diagnosis Date Noted  . Elevated CK 11/29/2016  . Chronic daily headache 09/22/2015  . Tobacco use disorder 03/23/2015  . Tonsil symptom 03/23/2015  . Routine general medical examination at a health care facility 03/23/2015  . Back pain 03/23/2015  . Numbness 08/28/2014  . Viral illness 08/28/2014  . Morbid obesity ([place]) 02/19/2014  . Leg skin lesion, left 01/21/2014  . Migraine 02/25/2013  . Allergic rhinitis 02/25/2013  . HSV-1 (herpes simplex virus 1) infection     Past Surgical History:  Procedure Laterality Date  . COLPOSCOPY  2008   has had procedure done couple times  . MOUTH SURGERY      OB History    Gravida  3   Para  1   Term  1   Preterm  0   AB  2   Living  1     SAB  1   TAB  1   Ectopic  0   Multiple  0   Live Births               Home Medications    Prior to Admission medications   Medication Sig Start Date End Date Taking? Authorizing Provider    eletriptan (RELPAX) 40 MG tablet Take 1 tablet (40 mg total) by mouth as needed for migraine or headache. May repeat in 2 hours if headache persists or recurs. 07/21/18   Patel, Donika K, DO  fluticasone (FLONASE) 50 MCG/ACT nasal spray Place 2 sprays into both nostrils daily. 10/11/19   Tasia Catchings, Laraya Pestka V, PA-C  ibuprofen (ADVIL,MOTRIN) 600 MG tablet Take 1 tablet (600 mg total) by mouth every 6 (six) hours as needed. 06/30/17   Varney Biles, MD  ipratropium (ATROVENT) 0.06 % nasal spray Place 2 sprays into both nostrils 4 (four) times daily. 10/11/19   Tasia Catchings, Matej Sappenfield V, PA-C  meloxicam (MOBIC) 7.5 MG tablet Take 1 tablet (7.5 mg total) by mouth daily. 01/09/17   Hedges, Dellis Filbert, PA-C  methocarbamol (ROBAXIN) 500 MG tablet Take 1 tablet (500 mg total) by mouth 2 (two) times daily. 07/22/18   Vanessa Kick, MD    Family History Family History  Problem Relation Age of Onset  . Hypertension Mother        Living  . Hyperlipidemia Father   . Hypertension Father   . Hypertension Maternal Grandmother   .  Stroke Maternal Grandmother   . Diabetes Maternal Grandfather   . Diabetes Paternal Grandmother   . Healthy Daughter     Social History Social History   Tobacco Use  . Smoking status: Former Smoker    Types: Cigars    Quit date: 02/11/2014    Years since quitting: 5.6  . Smokeless tobacco: Never Used  . Tobacco comment: quit last week  Substance Use Topics  . Alcohol use: Yes    Alcohol/week: 0.0 standard drinks    Comment: 1 glass of wine, 2-3 times week  . Drug use: No     Allergies   Rondec [chlorpheniramine-pseudoeph]   Review of Systems Review of Systems  Reason unable to perform ROS: See HPI as above.     Physical Exam Triage Vital Signs ED Triage Vitals  Enc Vitals Group     BP 10/11/19 1104 123/73     Pulse Rate 10/11/19 1104 75     Resp 10/11/19 1104 18     Temp 10/11/19 1104 98.1 F (36.7 C)     Temp Source 10/11/19 1104 Oral     SpO2 10/11/19 1104 95 %     Weight --       Height --      Head Circumference --      Peak Flow --      Pain Score 10/11/19 1116 4     Pain Loc --      Pain Edu? --      Excl. in GC? --    No data found.  Updated Vital Signs BP 123/73 (BP Location: Left Arm)   Pulse 75   Temp 98.1 F (36.7 C) (Oral)   Resp 18   LMP 10/10/2019   SpO2 95%   Physical Exam Constitutional:      General: She is not in acute distress.    Appearance: Normal appearance. She is not ill-appearing, toxic-appearing or diaphoretic.  HENT:     Head: Normocephalic and atraumatic.     Mouth/Throat:     Mouth: Mucous membranes are moist.     Pharynx: Oropharynx is clear. Uvula midline.     Tonsils: No tonsillar exudate. 1+ on the right. 1+ on the left.  Cardiovascular:     Rate and Rhythm: Normal rate and regular rhythm.     Heart sounds: Normal heart sounds. No murmur. No friction rub. No gallop.   Pulmonary:     Effort: Pulmonary effort is normal. No accessory muscle usage, prolonged expiration, respiratory distress or retractions.     Comments: Lungs clear to auscultation without adventitious lung sounds. Musculoskeletal:     Cervical back: Normal range of motion and neck supple.  Neurological:     General: No focal deficit present.     Mental Status: She is alert and oriented to person, place, and time.      UC Treatments / Results  Labs (all labs ordered are listed, but only abnormal results are displayed) Labs Reviewed  NOVEL CORONAVIRUS, NAA    EKG   Radiology No results found.  Procedures Procedures (including critical care time)  Medications Ordered in UC Medications - No data to display  Initial Impression / Assessment and Plan / UC Course  I have reviewed the triage vital signs and the nursing notes.  Pertinent labs & imaging results that were available during my care of the patient were reviewed by me and considered in my medical decision making (see chart for details).    COVID  PCR test ordered. Patient to  quarantine until testing results return. No alarming signs on exam.  Patient speaking in full sentences without respiratory distress.  Symptomatic treatment discussed.  Push fluids.  Return precautions given.  Patient expresses understanding and agrees to plan.  Final Clinical Impressions(s) / UC Diagnoses   Final diagnoses:  Cough  Sore throat  Post-nasal drip    ED Prescriptions    Medication Sig Dispense Auth. Provider   fluticasone (FLONASE) 50 MCG/ACT nasal spray Place 2 sprays into both nostrils daily. 1 g Amylee Lodato V, PA-C   ipratropium (ATROVENT) 0.06 % nasal spray Place 2 sprays into both nostrils 4 (four) times daily. 15 mL Belinda Fisher, PA-C     PDMP not reviewed this encounter.   Belinda Fisher, PA-C 10/11/19 1143

## 2019-10-11 NOTE — ED Triage Notes (Signed)
Patient is here with sore throat that started x one week ago, felt some better until Friday when the nasal congestion/drainage started.

## 2019-10-11 NOTE — Discharge Instructions (Signed)
COVID PCR testing ordered. I would like you to quarantine until testing results. Start flonase, atrovent nasal spray for nasal congestion/drainage. You can use over the counter nasal saline rinse such as neti pot for nasal congestion. Keep hydrated, your urine should be clear to pale yellow in color. Tylenol/motrin for fever and pain. Monitor for any worsening of symptoms, chest pain, shortness of breath, wheezing, swelling of the throat, go to the emergency department for further evaluation needed.   

## 2019-10-12 LAB — SARS-COV-2, NAA 2 DAY TAT

## 2019-10-12 LAB — NOVEL CORONAVIRUS, NAA: SARS-CoV-2, NAA: NOT DETECTED

## 2019-11-30 ENCOUNTER — Other Ambulatory Visit: Payer: Self-pay

## 2019-11-30 ENCOUNTER — Ambulatory Visit
Admission: EM | Admit: 2019-11-30 | Discharge: 2019-11-30 | Disposition: A | Discharge status: Home / Self Care | Payer: No Typology Code available for payment source | Attending: Physician Assistant | Admitting: Physician Assistant

## 2019-11-30 ENCOUNTER — Encounter: Payer: Self-pay | Admitting: Emergency Medicine

## 2019-11-30 DIAGNOSIS — J029 Acute pharyngitis, unspecified: Secondary | ICD-10-CM | POA: Diagnosis not present

## 2019-11-30 DIAGNOSIS — M542 Cervicalgia: Secondary | ICD-10-CM | POA: Diagnosis present

## 2019-11-30 LAB — POCT RAPID STREP A (OFFICE): Rapid Strep A Screen: NEGATIVE

## 2019-11-30 MED ORDER — TIZANIDINE HCL 2 MG PO TABS
2.0000 mg | ORAL_TABLET | Freq: Three times a day (TID) | ORAL | 0 refills | Status: DC | PRN
Start: 2019-11-30 — End: 2021-02-17

## 2019-11-30 MED ORDER — LIDOCAINE VISCOUS HCL 2 % MT SOLN: 15.0000 mL | OROMUCOSAL | 0 refills | Status: DC | PRN

## 2019-11-30 NOTE — Discharge Instructions (Signed)
Rapid strep negative. Symptoms are most likely due to viral illness/ drainage down your throat. Start lidocaine for sore throat, do not eat or drink for the next 40 mins after use as it can stunt your gag reflex. Monitor for any worsening of symptoms, swelling of the throat, trouble breathing, trouble swallowing, leaning forward to breath, drooling, go to the emergency department for further evaluation needed.  Tizanidine as needed for neck pain/pulling sensation. This is a muscle relaxant and can make you drowsy, so do not take if you are going to drive, operate heavy machinery, or make important decisions. Follow up with PCP if symptoms not improving.

## 2019-11-30 NOTE — ED Triage Notes (Signed)
Pt presents to [facility] for assessment of 2-3 weeks of a pulling sensation to her right/center anterior neck.  Patient states she feels lumps in her neck.  Patient states she then developed a sore throat yesterday, with some tonsil stones.

## 2019-11-30 NOTE — ED Notes (Signed)
Patient able to ambulate independently  

## 2019-11-30 NOTE — ED Provider Notes (Signed)
[name]    CSN: [id number] Arrival date & time: 11/30/19  1731      History   Chief Complaint Chief Complaint  Patient presents with  . Neck Pain  . Sore Throat    HPI Elizabeth Stevenson is a 34 y.o. female.   34 year old female comes in for 2-3 week history of pulling sensation of the right neck when bending over. Also having sore throat starting 2 days ago. Denies fever, rhinorrhea, nasal congestion, cough, shortness of breath. Has not taken anything for the symptoms.      Past Medical History:  Diagnosis Date  . Abnormal Pap smear   . Chlamydia   . GERD (gastroesophageal reflux disease)   . HSV-1 (herpes simplex virus 1) infection   . HSV-2 (herpes simplex virus 2) infection   . Migraine   . Ovarian cyst   . Pelvic pain   . Ventricular tachycardia [facility])     Patient Active Problem List   Diagnosis Date Noted  . Elevated CK 11/29/2016  . Chronic daily headache 09/22/2015  . Tobacco use disorder 03/23/2015  . Tonsil symptom 03/23/2015  . Routine general medical examination at a health care facility 03/23/2015  . Back pain 03/23/2015  . Numbness 08/28/2014  . Viral illness 08/28/2014  . Morbid obesity (HCC) 02/19/2014  . Leg skin lesion, left 01/21/2014  . Migraine 02/25/2013  . Allergic rhinitis 02/25/2013  . HSV-1 (herpes simplex virus 1) infection     Past Surgical History:  Procedure Laterality Date  . COLPOSCOPY  2008   has had procedure done couple times  . MOUTH SURGERY      OB History    Gravida  3   Para  1   Term  1   Preterm  0   AB  2   Living  1     SAB  1   TAB  1   Ectopic  0   Multiple  0   Live Births               Home Medications    Prior to Admission medications   Medication Sig Start Date End Date Taking? Authorizing Provider  eletriptan (RELPAX) 40 MG tablet Take 1 tablet (40 mg total) by mouth as needed for migraine or headache. May repeat in 2 hours if headache persists or recurs.  07/21/18   Patel, Donika K, DO  fluticasone (FLONASE) 50 MCG/ACT nasal spray Place 2 sprays into both nostrils daily. 10/11/19   Cathie Hoops, Nyla Creason V, PA-C  ibuprofen (ADVIL,MOTRIN) 600 MG tablet Take 1 tablet (600 mg total) by mouth every 6 (six) hours as needed. 06/30/17   Derwood Kaplan, MD  ipratropium (ATROVENT) 0.06 % nasal spray Place 2 sprays into both nostrils 4 (four) times daily. 10/11/19   Cathie Hoops, Audel Coakley V, PA-C  lidocaine (XYLOCAINE) 2 % solution Use as directed 15 mLs in the mouth or throat as needed for mouth pain. 11/30/19   Cathie Hoops, Jalisia Puchalski V, PA-C  meloxicam (MOBIC) 7.5 MG tablet Take 1 tablet (7.5 mg total) by mouth daily. 01/09/17   Hedges, Tinnie Gens, PA-C  methocarbamol (ROBAXIN) 500 MG tablet Take 1 tablet (500 mg total) by mouth 2 (two) times daily. 07/22/18   Mardella Layman, MD  tiZANidine (ZANAFLEX) 2 MG tablet Take 1 tablet (2 mg total) by mouth every 8 (eight) hours as needed for muscle spasms. 11/30/19   Belinda Fisher, PA-C    Family History Family History  Problem  Relation Age of Onset  . Hypertension Mother        Living  . Hyperlipidemia Father   . Hypertension Father   . Hypertension Maternal Grandmother   . Stroke Maternal Grandmother   . Diabetes Maternal Grandfather   . Diabetes Paternal Grandmother   . Healthy Daughter     Social History Social History   Tobacco Use  . Smoking status: Former Smoker    Types: Cigars    Quit date: 02/11/2014    Years since quitting: 5.8  . Smokeless tobacco: Never Used  . Tobacco comment: quit last week  Vaping Use  . Vaping Use: Never used  Substance Use Topics  . Alcohol use: Yes    Alcohol/week: 0.0 standard drinks    Comment: 1 glass of wine, 2-3 times week  . Drug use: No     Allergies   Rondec [chlorpheniramine-pseudoeph]   Review of Systems Review of Systems  Reason unable to perform ROS: See HPI as above.     Physical Exam Triage Vital Signs ED Triage Vitals  Enc Vitals Group     BP 11/30/19 1743 128/74     Pulse Rate  11/30/19 1743 80     Resp 11/30/19 1743 18     Temp 11/30/19 1743 98.3 F (36.8 C)     Temp Source 11/30/19 1743 Oral     SpO2 11/30/19 1743 96 %     Weight --      Height --      Head Circumference --      Peak Flow --      Pain Score 11/30/19 1745 7     Pain Loc --      Pain Edu? --      Excl. in GC? --    No data found.  Updated Vital Signs BP 128/74 (BP Location: Right Arm)   Pulse 80   Temp 98.3 F (36.8 C) (Oral)   Resp 18   LMP 11/12/2019   SpO2 96%   Visual Acuity Right Eye Distance:   Left Eye Distance:   Bilateral Distance:    Right Eye Near:   Left Eye Near:    Bilateral Near:     Physical Exam Constitutional:      General: She is not in acute distress.    Appearance: Normal appearance. She is well-developed. She is not toxic-appearing or diaphoretic.  HENT:     Head: Normocephalic and atraumatic.     Mouth/Throat:     Mouth: Mucous membranes are moist.     Pharynx: Oropharynx is clear. Uvula midline.     Tonsils: No tonsillar exudate. 1+ on the right. 1+ on the left.  Eyes:     Conjunctiva/sclera: Conjunctivae normal.     Pupils: Pupils are equal, round, and reactive to light.  Neck:     Thyroid: No thyroid mass or thyroid tenderness.     Trachea: Trachea normal. No tracheal tenderness.  Cardiovascular:     Rate and Rhythm: Normal rate and regular rhythm.  Pulmonary:     Effort: Pulmonary effort is normal. No respiratory distress.     Comments: LCTAB Musculoskeletal:     Cervical back: Normal range of motion and neck supple.     Comments: Tenderness to palpation of right middle trapezius.   Lymphadenopathy:     Cervical: No cervical adenopathy.  Skin:    General: Skin is warm and dry.  Neurological:     Mental Status: She is alert and  oriented to person, place, and time.      UC Treatments / Results  Labs (all labs ordered are listed, but only abnormal results are displayed) Labs Reviewed  CULTURE, GROUP A STREP [facility])  POCT RAPID  STREP A (OFFICE)    EKG   Radiology No results found.  Procedures Procedures (including critical care time)  Medications Ordered in UC Medications - No data to display  Initial Impression / Assessment and Plan / UC Course  I have reviewed the triage vital signs and the nursing notes.  Pertinent labs & imaging results that were available during my care of the patient were reviewed by me and considered in my medical decision making (see chart for details).    Rapid strep negative.  Discussed symptomatic treatment at this time.  Patient with 3-week history of pulling sensation/right neck pain, no signs of lymphadenopathy, thyromegaly at this time. ?  MSK pain, will provide tizanidine as needed.  Return precautions given.  Final Clinical Impressions(s) / UC Diagnoses   Final diagnoses:  Sore throat  Neck pain on right side    ED Prescriptions    Medication Sig Dispense Auth. Provider   lidocaine (XYLOCAINE) 2 % solution Use as directed 15 mLs in the mouth or throat as needed for mouth pain. 100 mL Tacoya Altizer V, PA-C   tiZANidine (ZANAFLEX) 2 MG tablet Take 1 tablet (2 mg total) by mouth every 8 (eight) hours as needed for muscle spasms. 15 tablet Belinda Fisher, PA-C     PDMP not reviewed this encounter.   Belinda Fisher, PA-C 11/30/19 1845

## 2019-12-03 LAB — CULTURE, GROUP A STREP (THRC)

## 2020-07-18 ENCOUNTER — Ambulatory Visit
Admission: EM | Admit: 2020-07-18 | Discharge: 2020-07-18 | Disposition: A | Discharge status: Home / Self Care | Payer: 59 | Attending: Family Medicine | Admitting: Family Medicine

## 2020-07-18 ENCOUNTER — Other Ambulatory Visit: Payer: Self-pay

## 2020-07-18 ENCOUNTER — Ambulatory Visit (INDEPENDENT_AMBULATORY_CARE_PROVIDER_SITE_OTHER): Payer: 59

## 2020-07-18 DIAGNOSIS — R1031 Right lower quadrant pain: Secondary | ICD-10-CM | POA: Diagnosis not present

## 2020-07-18 DIAGNOSIS — R1084 Generalized abdominal pain: Secondary | ICD-10-CM | POA: Diagnosis not present

## 2020-07-18 LAB — POCT URINALYSIS DIP (MANUAL ENTRY)
Bilirubin, UA: NEGATIVE
Glucose, UA: NEGATIVE mg/dL
Ketones, POC UA: NEGATIVE mg/dL
Leukocytes, UA: NEGATIVE
Nitrite, UA: NEGATIVE
Protein Ur, POC: NEGATIVE mg/dL
Spec Grav, UA: 1.015 (ref 1.010–1.025)
Urobilinogen, UA: 0.2 E.U./dL
pH, UA: 6.5 (ref 5.0–8.0)

## 2020-07-18 MED ORDER — DICYCLOMINE HCL 20 MG PO TABS
20.0000 mg | ORAL_TABLET | Freq: Two times a day (BID) | ORAL | 0 refills | Status: DC | PRN
Start: 2020-07-18 — End: 2021-02-17

## 2020-07-18 NOTE — ED Triage Notes (Addendum)
Pt presents with lower abdominal pain. Reports that she is concerned for inflamed colon. She has an appt with her gi doctor next Tuesday. She has a history of inflamed colon. Reports pain is on the mid right side x 2 months. Reports the pain is getting progressively worse. Reports pain also goes into her back and is concerned for possible kidney issues.

## 2020-07-18 NOTE — Discharge Instructions (Addendum)
  If abdominal pain becomes severe , develop nausea with vomiting, severe fever go immediately to the emergency department for further evaluation of the symptoms. Otherwise keep upcoming follow-up with Dr. Loreta Ave.

## 2020-07-18 NOTE — ED Provider Notes (Signed)
[name]    CSN: [id number] Arrival date & time: 07/18/20  1705      History   Chief Complaint Chief Complaint  Patient presents with  . Abdominal Pain    HPI Elizabeth Stevenson is a 35 y.o. female.   HPI  Patient presents for chronic recurrent mid right abdominal pain. She has an upcoming appointment with her Gastroenterologist Dr. Charna Elizabeth. Reports a history of irritable bowel syndrome and GERD and is currently not taking or prescribed medication for either condition. Endorses worsening right abdominal pain over the last two weeks. No nausea or vomiting. No unexplained weight loss.  Endorses bloating and stools have recently been normal,  However has periods of constipation and increase BM frequency. Past Medical History:  Diagnosis Date  . Abnormal Pap smear   . Chlamydia   . GERD (gastroesophageal reflux disease)   . HSV-1 (herpes simplex virus 1) infection   . HSV-2 (herpes simplex virus 2) infection   . Migraine   . Ovarian cyst   . Pelvic pain   . Ventricular tachycardia [facility])     Patient Active Problem List   Diagnosis Date Noted  . Elevated CK 11/29/2016  . Chronic daily headache 09/22/2015  . Tobacco use disorder 03/23/2015  . Tonsil symptom 03/23/2015  . Routine general medical examination at a health care facility 03/23/2015  . Back pain 03/23/2015  . Numbness 08/28/2014  . Viral illness 08/28/2014  . Morbid obesity (HCC) 02/19/2014  . Leg skin lesion, left 01/21/2014  . Migraine 02/25/2013  . Allergic rhinitis 02/25/2013  . HSV-1 (herpes simplex virus 1) infection     Past Surgical History:  Procedure Laterality Date  . COLPOSCOPY  2008   has had procedure done couple times  . MOUTH SURGERY      OB History    Gravida  3   Para  1   Term  1   Preterm  0   AB  2   Living  1     SAB  1   IAB  1   Ectopic  0   Multiple  0   Live Births               Home Medications    Prior to Admission medications    Medication Sig Start Date End Date Taking? Authorizing Provider  eletriptan (RELPAX) 40 MG tablet Take 1 tablet (40 mg total) by mouth as needed for migraine or headache. May repeat in 2 hours if headache persists or recurs. 07/21/18   Patel, Donika K, DO  fluticasone (FLONASE) 50 MCG/ACT nasal spray Place 2 sprays into both nostrils daily. 10/11/19   Cathie Hoops, Amy V, PA-C  ibuprofen (ADVIL,MOTRIN) 600 MG tablet Take 1 tablet (600 mg total) by mouth every 6 (six) hours as needed. 06/30/17   Derwood Kaplan, MD  ipratropium (ATROVENT) 0.06 % nasal spray Place 2 sprays into both nostrils 4 (four) times daily. 10/11/19   Cathie Hoops, Amy V, PA-C  lidocaine (XYLOCAINE) 2 % solution Use as directed 15 mLs in the mouth or throat as needed for mouth pain. 11/30/19   Cathie Hoops, Amy V, PA-C  meloxicam (MOBIC) 7.5 MG tablet Take 1 tablet (7.5 mg total) by mouth daily. 01/09/17   Hedges, Tinnie Gens, PA-C  methocarbamol (ROBAXIN) 500 MG tablet Take 1 tablet (500 mg total) by mouth 2 (two) times daily. 07/22/18   Mardella Layman, MD  tiZANidine (ZANAFLEX) 2 MG tablet Take 1 tablet (2 mg total) by  mouth every 8 (eight) hours as needed for muscle spasms. 11/30/19   Belinda Fisher, PA-C    Family History Family History  Problem Relation Age of Onset  . Hypertension Mother        Living  . Hyperlipidemia Father   . Hypertension Father   . Hypertension Maternal Grandmother   . Stroke Maternal Grandmother   . Diabetes Maternal Grandfather   . Diabetes Paternal Grandmother   . Healthy Daughter     Social History Social History   Tobacco Use  . Smoking status: Former Smoker    Types: Cigars    Quit date: 02/11/2014    Years since quitting: 6.4  . Smokeless tobacco: Never Used  . Tobacco comment: quit last week  Vaping Use  . Vaping Use: Never used  Substance Use Topics  . Alcohol use: Yes    Alcohol/week: 0.0 standard drinks    Comment: 1 glass of wine, 2-3 times week  . Drug use: No     Allergies   Rondec  [chlorpheniramine-pseudoeph]   Review of Systems Review of Systems Pertinent negatives listed in HPI Physical Exam Triage Vital Signs ED Triage Vitals [07/18/20 1806]  Enc Vitals Group     BP 118/81     Pulse Rate 80     Resp 17     Temp 98.9 F (37.2 C)     Temp src      SpO2 96 %     Weight      Height      Head Circumference      Peak Flow      Pain Score 6     Pain Loc      Pain Edu?      Excl. in GC?    No data found.  Updated Vital Signs BP 118/81   Pulse 80   Temp 98.9 F (37.2 C)   Resp 17   LMP 07/17/2020   SpO2 96%   Visual Acuity Right Eye Distance:   Left Eye Distance:   Bilateral Distance:    Right Eye Near:   Left Eye Near:    Bilateral Near:     Physical Exam Constitutional:      General: She is not in acute distress.    Appearance: She is obese. She is not ill-appearing or toxic-appearing.  Cardiovascular:     Rate and Rhythm: Normal rate and regular rhythm.  Abdominal:     General: Abdomen is protuberant. Bowel sounds are normal. There is no distension.     Palpations: Abdomen is soft. There is no mass.     Tenderness: There is abdominal tenderness in the right upper quadrant and epigastric area. There is no right CVA tenderness or left CVA tenderness.  Skin:    General: Skin is warm.     Capillary Refill: Capillary refill takes less than 2 seconds.  Neurological:     General: No focal deficit present.     Mental Status: She is alert.  Psychiatric:        Mood and Affect: Mood normal.        Behavior: Behavior normal.      UC Treatments / Results  Labs (all labs ordered are listed, but only abnormal results are displayed) Labs Reviewed  POCT URINALYSIS DIP (MANUAL ENTRY) - Abnormal; Notable for the following components:      Result Value   Blood, UA large (*)    All other components within normal limits  EKG   Radiology DG Abdomen 1 View  Result Date: 07/18/2020 CLINICAL DATA:  Lower abdominal pain, right-sided  pain for 2.5 months EXAM: ABDOMEN - 1 VIEW COMPARISON:  06/30/2017 FINDINGS: Two supine frontal views of the abdomen and pelvis excludes the hemidiaphragms by collimation. Bowel gas pattern is unremarkable without obstruction or ileus. There are no masses or abnormal calcifications. Phleboliths are seen within the pelvis. No acute bony abnormalities. IMPRESSION: 1. Unremarkable bowel gas pattern. Electronically Signed   By: Sharlet Salina M.D.   On: 07/18/2020 18:43    Procedures Procedures (including critical care time)  Medications Ordered in UC Medications - No data to display  Initial Impression / Assessment and Plan / UC Course  I have reviewed the triage vital signs and the nursing notes.  Pertinent labs & imaging results that were available during my care of the patient were reviewed by me and considered in my medical decision making (see chart for details).    Right lower quadrant pain, chronic, recurring.  Has follow-up scheduled with GI doctor. KUB and  UA both unremarkable. Unable to determine cause of symptoms, however suspect, this is related to combination of IBS and GERD. Given significance of abdominal pain intermittently causing distress, agreed to trial Bentyl to manage abdominal spasms.GI appointment with 7 days, however, explained if abdominal pain becomes severe, ER evaluation is indicated.  Final Clinical Impressions(s) / UC Diagnoses   Final diagnoses:  Right lower quadrant abdominal pain     Discharge Instructions      If abdominal pain becomes severe , develop nausea with vomiting, severe fever go immediately to the emergency department for further evaluation of the symptoms. Otherwise keep upcoming follow-up with Dr. Loreta Ave.    ED Prescriptions    Medication Sig Dispense Auth. Provider   dicyclomine (BENTYL) 20 MG tablet Take 1 tablet (20 mg total) by mouth 2 (two) times daily as needed for spasms. 20 tablet Bing Neighbors, FNP     PDMP not reviewed  this encounter.   Bing Neighbors, FNP 07/22/20 [phone]

## 2020-10-07 ENCOUNTER — Other Ambulatory Visit: Payer: Self-pay

## 2020-10-07 ENCOUNTER — Emergency Department (HOSPITAL_COMMUNITY)
Admission: EM | Admit: 2020-10-07 | Discharge: 2020-10-07 | Disposition: A | Discharge status: Home / Self Care | Payer: 59 | Attending: Emergency Medicine | Admitting: Emergency Medicine

## 2020-10-07 ENCOUNTER — Emergency Department (HOSPITAL_COMMUNITY): Payer: 59

## 2020-10-07 ENCOUNTER — Encounter (HOSPITAL_COMMUNITY): Payer: Self-pay | Admitting: *Deleted

## 2020-10-07 DIAGNOSIS — R519 Headache, unspecified: Secondary | ICD-10-CM | POA: Insufficient documentation

## 2020-10-07 DIAGNOSIS — Z5321 Procedure and treatment not carried out due to patient leaving prior to being seen by health care provider: Secondary | ICD-10-CM | POA: Diagnosis not present

## 2020-10-07 DIAGNOSIS — R200 Anesthesia of skin: Secondary | ICD-10-CM | POA: Insufficient documentation

## 2020-10-07 LAB — COMPREHENSIVE METABOLIC PANEL
ALT: 13 U/L (ref 0–44)
AST: 17 U/L (ref 15–41)
Albumin: 3.6 g/dL (ref 3.5–5.0)
Alkaline Phosphatase: 54 U/L (ref 38–126)
Anion gap: 8 (ref 5–15)
BUN: 13 mg/dL (ref 6–20)
CO2: 24 mmol/L (ref 22–32)
Calcium: 8.8 mg/dL — ABNORMAL LOW (ref 8.9–10.3)
Chloride: 105 mmol/L (ref 98–111)
Creatinine, Ser: 1.02 mg/dL — ABNORMAL HIGH (ref 0.44–1.00)
GFR, Estimated: >60 mL/min (ref >60)
Glucose, Bld: 92 mg/dL (ref 70–99)
Potassium: 3.5 mmol/L (ref 3.5–5.1)
Sodium: 137 mmol/L (ref 135–145)
Total Bilirubin: 0.6 mg/dL (ref 0.3–1.2)
Total Protein: 6.9 g/dL (ref 6.5–8.1)

## 2020-10-07 LAB — CBC WITH DIFFERENTIAL/PLATELET
Abs Immature Granulocytes: 0.01 10*3/uL (ref 0.00–0.07)
Basophils Absolute: 0 10*3/uL (ref 0.0–0.1)
Basophils Relative: 1 %
Eosinophils Absolute: 0.2 10*3/uL (ref 0.0–0.5)
Eosinophils Relative: 3 %
HCT: 41.4 % (ref 36.0–46.0)
Hemoglobin: 13.2 g/dL (ref 12.0–15.0)
Immature Granulocytes: 0 %
Lymphocytes Relative: 41 %
Lymphs Abs: 2.3 10*3/uL (ref 0.7–4.0)
MCH: 31.4 pg (ref 26.0–34.0)
MCHC: 31.9 g/dL (ref 30.0–36.0)
MCV: 98.6 fL (ref 80.0–100.0)
Monocytes Absolute: 0.4 10*3/uL (ref 0.1–1.0)
Monocytes Relative: 7 %
Neutro Abs: 2.8 10*3/uL (ref 1.7–7.7)
Neutrophils Relative %: 48 %
Platelets: 276 10*3/uL (ref 150–400)
RBC: 4.2 MIL/uL (ref 3.87–5.11)
RDW: 12.9 % (ref 11.5–15.5)
WBC: 5.8 10*3/uL (ref 4.0–10.5)
nRBC: 0 % (ref 0.0–0.2)

## 2020-10-07 LAB — I-STAT BETA HCG BLOOD, ED (MC, WL, AP ONLY): I-stat hCG, quantitative: <5 m[IU]/mL (ref <5)

## 2020-10-07 LAB — MAGNESIUM: Magnesium: 1.6 mg/dL — ABNORMAL LOW (ref 1.7–2.4)

## 2020-10-07 NOTE — ED Notes (Signed)
Called for vitals x2, no response. Moving OTF.

## 2020-10-07 NOTE — ED Provider Notes (Signed)
Emergency Medicine Provider Triage Evaluation Note  Elizabeth Stevenson , Stevenson 35 y.o. female  was evaluated in triage.  Pt complains of multiple complaints.  She has had Stevenson pressure sensation to the left side of her head around her temporal region x3 weeks.  Does have history of chronic migraine however states this feels different.  She denies any vision changes.  States she has had persistent numbness to the left side of her face and intermittent numbness to her right zygomatic over similar time frame.  No pain with jaw movement.  States she occasionally gets numbness or tingling to bilateral hands and feet which have been occurring over the last few years.  No rashes to face. Has also had some generalized abdominal pain.  Had Stevenson HIDA scan many years ago.  No emesis or diarrhea.  She sees Dr. Allena Katz with neurology who encouraged her to come here to emergency department for evaluation.  No recent traumatic injuries to head.  No history of pseudotumor, no recent LPs, sinus infection.  Review of Systems  Positive: Headache, numbness Negative: Emesis, vision changes, weakness  Physical Exam  BP 122/79 (BP Location: Right Arm)   Pulse 89   Temp 99.1 F (37.3 C) (Oral)   Resp 18   SpO2 100%  Gen:   Awake, no distress   Resp:  Normal effort  MSK:   Moves extremities without difficulty  Neuro:  Cranial nerves II through grossly intact, equal handgrip bilaterally, moves all 4 extremities without difficulty ABD:  Soft, non distended Other:    Medical Decision Making  Medically screening exam initiated at 6:26 PM.  Appropriate orders placed.  Elizabeth Stevenson was informed that the remainder of the evaluation will be completed by another provider, this initial triage assessment does not replace that evaluation, and the importance of remaining in the ED until their evaluation is complete.  Headache, paresthesias for the last 3 weeks.  Labs and imaging ordered>> stable for waiting room   Elizabeth Stevenson,  Elizabeth Kihn A, PA-C 10/07/20 1830    Sabino Donovan, MD 10/07/20 [phone]

## 2020-10-07 NOTE — ED Triage Notes (Signed)
Headache lt sided for 3 weeks  She has migraine headaches but this does not feel like one she has a flutering in her chest intermittent  And some rt arm numbness  lmp  2 weeks ago

## 2020-10-11 ENCOUNTER — Telehealth: Payer: Self-pay | Admitting: Neurology

## 2020-10-11 NOTE — Telephone Encounter (Signed)
Noted. Patient scheduled to see me on 6/3, I will assess her at this visit.

## 2020-10-11 NOTE — Telephone Encounter (Signed)
Please advise 

## 2020-10-14 ENCOUNTER — Ambulatory Visit: Payer: 59 | Admitting: Neurology

## 2020-10-20 ENCOUNTER — Other Ambulatory Visit: Payer: Self-pay | Admitting: Gastroenterology

## 2020-10-20 DIAGNOSIS — R1033 Periumbilical pain: Secondary | ICD-10-CM

## 2020-10-31 ENCOUNTER — Ambulatory Visit
Admission: RE | Admit: 2020-10-31 | Discharge: 2020-10-31 | Disposition: A | Discharge status: Home / Self Care | Payer: 59 | Source: Ambulatory Visit | Attending: Gastroenterology | Admitting: Gastroenterology

## 2020-10-31 DIAGNOSIS — R1033 Periumbilical pain: Secondary | ICD-10-CM

## 2020-10-31 MED ORDER — IOPAMIDOL (ISOVUE-300) INJECTION 61%
100.0000 mL | Freq: Once | INTRAVENOUS | Status: AC | PRN
  Administered 2020-10-31: 100 mL via INTRAVENOUS

## 2020-11-02 ENCOUNTER — Other Ambulatory Visit (HOSPITAL_COMMUNITY): Payer: Self-pay | Admitting: Gastroenterology

## 2020-11-02 ENCOUNTER — Other Ambulatory Visit: Payer: Self-pay | Admitting: Gastroenterology

## 2020-11-02 DIAGNOSIS — R110 Nausea: Secondary | ICD-10-CM

## 2020-11-02 DIAGNOSIS — R1011 Right upper quadrant pain: Secondary | ICD-10-CM

## 2020-11-03 ENCOUNTER — Other Ambulatory Visit: Payer: 59

## 2020-11-28 ENCOUNTER — Encounter: Payer: Self-pay | Admitting: Neurology

## 2020-11-28 ENCOUNTER — Other Ambulatory Visit: Payer: Self-pay

## 2020-11-28 ENCOUNTER — Ambulatory Visit (INDEPENDENT_AMBULATORY_CARE_PROVIDER_SITE_OTHER): Payer: 59 | Admitting: Neurology

## 2020-11-28 VITALS — BP 114/73 | HR 78 | Ht 64.0 in | Wt 252.5 lb

## 2020-11-28 DIAGNOSIS — R200 Anesthesia of skin: Secondary | ICD-10-CM

## 2020-11-28 NOTE — Patient Instructions (Signed)
Nerve testing of the arms.  Do not apply any lotion or oil to your skin.   ELECTROMYOGRAM AND NERVE CONDUCTION STUDIES (EMG/NCS) INSTRUCTIONS  How to Prepare The neurologist conducting the EMG will need to know if you have certain medical conditions. Tell the neurologist and other EMG lab personnel if you: Have a pacemaker or any other electrical medical device Take blood-thinning medications Have hemophilia, a blood-clotting disorder that causes prolonged bleeding Bathing Take a shower or bath shortly before your exam in order to remove oils from your skin. Don't apply lotions or creams before the exam.  What to Expect You'll likely be asked to change into a hospital gown for the procedure and lie down on an examination table. The following explanations can help you understand what will happen during the exam.  Electrodes. The neurologist or a technician places surface electrodes at various locations on your skin depending on where you're experiencing symptoms. Or the neurologist may insert needle electrodes at different sites depending on your symptoms.  Sensations. The electrodes will at times transmit a tiny electrical current that you may feel as a twinge or spasm. The needle electrode may cause discomfort or pain that usually ends shortly after the needle is removed. If you are concerned about discomfort or pain, you may want to talk to the neurologist about taking a short break during the exam.  Instructions. During the needle EMG, the neurologist will assess whether there is any spontaneous electrical activity when the muscle is at rest - activity that isn't present in healthy muscle tissue - and the degree of activity when you slightly contract the muscle.  He or she will give you instructions on resting and contracting a muscle at appropriate times. Depending on what muscles and nerves the neurologist is examining, he or she may ask you to change positions during the exam.  After your  EMG You may experience some temporary, minor bruising where the needle electrode was inserted into your muscle. This bruising should fade within several days. If it persists, contact your primary care doctor.

## 2020-11-28 NOTE — Progress Notes (Signed)
Follow-up Visit   Date: 11/28/20    [name] MRN: [id number] DOB: 1985/07/02   Interim History: Elizabeth Stevenson is a 35 y.o. right-handed African American female with migraines and GERD returning to the clinic for follow-up Bilateral hand paresthesias and new complaints of cramps.  The patient was accompanied to the clinic by self.  History of present illness: Starting around 2013, she noticed bilateral hand numbness and tingling sensation over the left forearm and upper arm.  Symptoms can occur at rest and worse when she is sleeping. She notices is more when she is waking up and is easily able to shake her hands awake within a few minutes.  Symptoms occur about 1-2 times per week. She has difficulty opening tops of bottles only with her right hand.  She endorses neck and pain pain.     Migraines started in 2011.  Pain is located over the left frontal region, throbbing pain.  Pain lasts all day, occuring about 11 times per month.  She takes Aleve which does not help.  Triggers include certain smells.  She has occasional photophobia, phonophobia, n/v.  She has imitrex but does not like the way it makes her feel.    She continues to have bilateral arm numbness but accidentally missed her EMG appointment.   UPDATE 09/22/2015:  She was seen in the emergency department on 4/24 for atypical chest pain, anxiety, and severe migraine which responded to migraine cocktail.  She reports waking up several nights in a raw with shortness of breath and severe anxiety. She felt as if she was having a panic attack, similar to what she has previously experienced in 2008.  Her anxiety level is always high and she feels that anything stressful can make her more anxious.  In late April had moved to a different apartment and states that she may have triggered her anxiety and headache.   UPDATE 06/11/2017:  Patient was last seen in 2017 and is here with complaints of worsening hand numbness and  cramps.  Her migraines have improved significantly and now only gets them about once every few months.  She was using relpax, which helped but does not have this anymore.  She is having worsening tingling of the arms and hands, which is occurring several times per week.  She sometimes wakes up with her arms falling asleep.  She denies any specific weakness, but has difficulty with buttons.  She also complains of cramps involving the legs, worse at night. Cramps can occur in the arms with holding her iPhone or in her neck and legs. There is no specific triggers such as activity.    UPDATE 07/18/2018:  Over the past few months, she felt that her right eyelid was heavy and the right side of her lip was droopy.  She went to see her eye doctor who did not find any problems and and recommended she see me for concern of Bell's palsy. Patient reports that her droopiness is intermittent and there is no pattern.  There is no complete eye closure, double vision, slurred speech, difficulty swallowing or limb weakness.  Her last migraine was in February and overall is very well-controlled, occurring about 2-4 times per year and responsive to Relpax.  She continues to have bilateral arm numbness and did not schedule prior EMG.  She does not want to pursue this now because of lack of insurance.  UPDATE 11/28/2020:  She was last seen in 2020.  In May, she developed  worsening headaches and numbness over her face. She went to the ER where CT head was negative.  She tells me that her magnesium, creatinine, and calcium was abnormal.  She has not seen her PCP to have this repeated.    She is here because of worsening cramps in the arms and legs, worsening numbness/tingling in the hands and feet, face, neck and face.    Medications:  Current Outpatient Medications on File Prior to Visit  Medication Sig Dispense Refill   dicyclomine (BENTYL) 20 MG tablet Take 1 tablet (20 mg total) by mouth 2 (two) times daily as needed for  spasms. (Patient not taking: Reported on 11/28/2020) 20 tablet 0   eletriptan (RELPAX) 40 MG tablet Take 1 tablet (40 mg total) by mouth as needed for migraine or headache. May repeat in 2 hours if headache persists or recurs. (Patient not taking: Reported on 11/28/2020) 10 tablet 3   fluticasone (FLONASE) 50 MCG/ACT nasal spray Place 2 sprays into both nostrils daily. (Patient not taking: Reported on 11/28/2020) 1 g 0   ipratropium (ATROVENT) 0.06 % nasal spray Place 2 sprays into both nostrils 4 (four) times daily. (Patient not taking: Reported on 11/28/2020) 15 mL 0   lidocaine (XYLOCAINE) 2 % solution Use as directed 15 mLs in the mouth or throat as needed for mouth pain. (Patient not taking: Reported on 11/28/2020) 100 mL 0   methocarbamol (ROBAXIN) 500 MG tablet Take 1 tablet (500 mg total) by mouth 2 (two) times daily. (Patient not taking: Reported on 11/28/2020) 20 tablet 0   tiZANidine (ZANAFLEX) 2 MG tablet Take 1 tablet (2 mg total) by mouth every 8 (eight) hours as needed for muscle spasms. (Patient not taking: Reported on 11/28/2020) 15 tablet 0   No current facility-administered medications on file prior to visit.    Allergies:  Allergies  Allergen Reactions   Rondec [Chlorpheniramine-Pseudoeph] Other (See Comments)    Hallucinations.  Not sure which formulation of rondec (brompheniramine vs. Chlorpheniramine)    Vital Signs:  BP 114/73   Pulse 78   Ht 5\' 4"  (1.626 m)   Wt 252 lb 8 oz (114.5 kg)   SpO2 98%   BMI 43.34 kg/m    Neurological Exam: MENTAL STATUS including orientation to time, place, person, recent and remote memory, attention span and concentration, language, and fund of knowledge is normal.  Speech is not dysarthric.  CRANIAL NERVES: No visual field defects. Pupils equal round and reactive to light.  Normal conjugate, extra-ocular eye movements in all directions of gaze.  No ptosis.   Face is symmetric. Facial muscles are 5/5.  Palate elevates symmetrically.   Tongue is midline.  MOTOR:  Motor strength is 5/5 in all extremities.  No pronator drift.  Tone is normal.    MSRs:  Reflexes are 2+/4 throughout.  SENSORY:  Intact to vibration and temperature.   COORDINATION/GAIT:  Normal finger-to- nose-finger.  Gait narrow based and stable. Tandem gait intact  DATA: Lab Results  Component Value Date   VITAMINB12 383 06/11/2017   Lab Results  Component Value Date   CKTOTAL 74 06/11/2017   CT head 10/07/2020:  negative   IMPRESSION/PLAN: Generalized paresthesias of the hands and feet, worse in the arms ?ulnar neuropathy - NCS/EMG of the arms - Consider MRI brain going forward  2.  Migraine with aura and left facial paresthesias   3.  Metabolic changes on labs are borderline, I suggest that she establish care with PCP to have this rechecked.  Thank you for allowing me to participate in patient's care.  If I can answer any additional questions, I would be pleased to do so.    Sincerely,    Besnik Febus K. Posey Pronto, DO

## 2021-01-03 ENCOUNTER — Encounter: Payer: 59 | Admitting: Neurology

## 2021-02-17 ENCOUNTER — Encounter: Payer: Self-pay | Admitting: Cardiovascular Disease

## 2021-02-17 ENCOUNTER — Other Ambulatory Visit: Payer: Self-pay | Admitting: Cardiovascular Disease

## 2021-02-17 ENCOUNTER — Ambulatory Visit: Payer: 59

## 2021-02-17 ENCOUNTER — Ambulatory Visit (INDEPENDENT_AMBULATORY_CARE_PROVIDER_SITE_OTHER): Payer: 59 | Admitting: Cardiovascular Disease

## 2021-02-17 ENCOUNTER — Telehealth: Payer: Self-pay

## 2021-02-17 ENCOUNTER — Other Ambulatory Visit: Payer: Self-pay

## 2021-02-17 VITALS — BP 112/70 | HR 83 | Ht 64.0 in | Wt 251.2 lb

## 2021-02-17 DIAGNOSIS — F17200 Nicotine dependence, unspecified, uncomplicated: Secondary | ICD-10-CM

## 2021-02-17 DIAGNOSIS — R002 Palpitations: Secondary | ICD-10-CM

## 2021-02-17 DIAGNOSIS — G4733 Obstructive sleep apnea (adult) (pediatric): Secondary | ICD-10-CM | POA: Diagnosis not present

## 2021-02-17 DIAGNOSIS — Z0000 Encounter for general adult medical examination without abnormal findings: Secondary | ICD-10-CM

## 2021-02-17 NOTE — Assessment & Plan Note (Addendum)
Patient is unaware of whether she snores at night but does have daytime somnolence and a body habitus consistent with obstructive sleep apnea.  Because of her increasing frequency of palpitations I am going to get a home sleep study to further evaluate.  Her Epworth sleepiness score was 12.

## 2021-02-17 NOTE — Progress Notes (Signed)
02/17/2021 Elizabeth Stevenson   26-Jun-1985  [id number]  Primary Physician Elizabeth Stevenson Primary Cardiologist: Elizabeth Gess MD Elizabeth Stevenson, [place]  HPI:  Elizabeth Stevenson is a 35 y.o. 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  She is self-referred because of increased frequency of palpitations.  She did see Elizabeth Stevenson for evaluation of this 04/29/2012 had a 2D echo which was essentially normal and a 30-day Holter monitor which apparently was not done.  She does smoke 2-3 mini cigars a day, drinks excessively by her own account and has 1 to 2 cups of coffee a day.  She also has daytime somnolence.  Over the recent months she has had occasional dizziness, atypical chest pain and increased palpitations that occur several times a week.   Current Meds  Medication Sig   BLACK CURRANT SEED OIL PO Take by mouth.   cetirizine (ZYRTEC) 10 MG tablet cetirizine 10 mg tablet   Cholecalciferol 1.25 MG (50000 UT) capsule cholecalciferol (vitamin D3) 1,250 mcg (50,000 unit) capsule  TAKE 1 CAPSULE BY MOUTH ONCE A WEEK   eletriptan (RELPAX) 40 MG tablet Take 1 tablet (40 mg total) by mouth as needed for migraine or headache. May repeat in 2 hours if headache persists or recurs.   MAGNESIUM PO Take by mouth.   methocarbamol (ROBAXIN) 500 MG tablet Take 1 tablet (500 mg total) by mouth 2 (two) times daily.   Multiple Vitamins-Minerals (ZINC PO) Take by mouth.   Turmeric (QC TUMERIC COMPLEX PO) Take by mouth.     Allergies  Allergen Reactions   Rondec [Chlorpheniramine-Pseudoeph] Other (See Comments)    Hallucinations.  Not sure which formulation of rondec (brompheniramine vs. Chlorpheniramine)    Social History   Socioeconomic History   Marital status: Single    Spouse name: Not on file   Number of children: 1   Years of education: Not on file   Highest  education level: Not on file  Occupational History   Not on file  Tobacco Use   Smoking status: Former    Types: Cigars    Quit date: 02/11/2014    Years since quitting: 7.0   Smokeless tobacco: Never   Tobacco comments:    quit last week  Vaping Use   Vaping Use: Never used  Substance and Sexual Activity   Alcohol use: Yes    Alcohol/week: 0.0 standard drinks    Comment: 1 glass of wine, 2-3 times week   Drug use: No   Sexual activity: Yes    Birth control/protection: Implant  Other Topics Concern   Not on file  Social History Narrative      Highest level of education:  Some college   Caffeine Use-yes   Regular exercise-no   Social Determinants of Health   Financial Resource Strain: Not on file  Food Insecurity: Not on file  Transportation Needs: Not on file  Physical Activity: Not on file  Stress: Not on file  Social Connections: Not on file  Intimate Partner Violence: Not on file     Review of Systems: General: negative for chills, fever, night sweats or weight changes.  Cardiovascular: negative for chest pain, dyspnea on exertion, edema, orthopnea, palpitations, paroxysmal nocturnal dyspnea or shortness of breath Dermatological: negative for rash Respiratory: negative for cough or wheezing Urologic: negative for hematuria Abdominal: negative for nausea, vomiting, diarrhea,  bright red blood per rectum, melena, or hematemesis Neurologic: negative for visual changes, syncope, or dizziness All other systems reviewed and are otherwise negative except as noted above.    Blood pressure 112/70, pulse 83, height 5\' 4"  (1.626 m), weight 251 lb 3.2 oz (113.9 kg), SpO2 97 %.  General appearance: alert and no distress Neck: no adenopathy, no carotid bruit, no JVD, supple, symmetrical, trachea midline, and thyroid not enlarged, symmetric, no tenderness/mass/nodules Lungs: clear to auscultation bilaterally Heart: regular rate and rhythm, S1, S2 normal, no murmur, click, rub  or gallop Extremities: extremities normal, atraumatic, no cyanosis or edema Pulses: 2+ and symmetric Skin: Skin color, texture, turgor normal. No rashes or lesions Neurologic: Grossly normal  EKG sinus rhythm 83 without ST or T wave changes.  Personally reviewed this EKG.  ASSESSMENT AND PLAN:   Palpitations Elizabeth Stevenson  is self-referred because of palpitations.  She did see Dr. Doyce Para back in 2013.  A Holter monitor was ordered but never performed.  She has had increased frequency and severity of palpitations recently.  She does admit to drinking alcohol and caffeine.  I am going to get a 2D echo, thyroid function tests and a 2-week Zio patch to further evaluate  Tobacco use disorder Patient smokes 3-5 small cigars a day.  Talked about the importance of smoking cessation  Obstructive sleep apnea Patient is unaware of whether she snores at night but does have daytime somnolence and a body habitus consistent with obstructive sleep apnea.  Because of her increasing frequency of palpitations I am going to get a home sleep study to further evaluate.     2014 MD FACP,FACC,FAHA, [facility] 02/17/2021 10:48 AM

## 2021-02-17 NOTE — Assessment & Plan Note (Signed)
Patient smokes 3-5 small cigars a day.  Talked about the importance of smoking cessation

## 2021-02-17 NOTE — Assessment & Plan Note (Signed)
Elizabeth Stevenson  is self-referred because of palpitations.  She did see Dr. Eden Emms back in 2013.  A Holter monitor was ordered but never performed.  She has had increased frequency and severity of palpitations recently.  She does admit to drinking alcohol and caffeine.  I am going to get a 2D echo, thyroid function tests and a 2-week Zio patch to further evaluate

## 2021-02-17 NOTE — Progress Notes (Unsigned)
Enrolled patient for a 14 day Zio XT  monitor to be mailed to patients home  °

## 2021-02-17 NOTE — Telephone Encounter (Signed)
Called patient to discuss health coaching for smoking and alcohol cessation per referral from [facility]. Left message for patient to return call to discuss health coaching in detail.    Graciela Plato Nedra Hai, [facility] Guide, Health Coach [address] [place] [address] Telephone: [phone] Email: [email or web address]

## 2021-02-17 NOTE — Patient Instructions (Signed)
Medication Instructions:  Your physician recommends that you continue on your current medications as directed. Please refer to the Current Medication list given to you today.  *If you need a refill on your cardiac medications before your next appointment, please call your pharmacy*   Lab Work: Your physician recommends that you have labs drawn today: lipid/liver profile and TSH/Free T4   If you have labs (blood work) drawn today and your tests are completely normal, you will receive your results only by: MyChart Message (if you have MyChart) OR A paper copy in the mail If you have any lab test that is abnormal or we need to change your treatment, we will call you to review the results.   Testing/Procedures: Your physician has requested that you have an echocardiogram. Echocardiography is a painless test that uses sound waves to create images of your heart. It provides your doctor with information about the size and shape of your heart and how well your heart's chambers and valves are working. This procedure takes approximately one hour. There are no restrictions for this procedure.  Dr. Allyson Sabal has ordered a CT coronary calcium score. This test is done at [address]. This is $99 out of pocket.   Coronary CalciumScan A coronary calcium scan is an imaging test used to look for deposits of calcium and other fatty materials (plaques) in the inner lining of the blood vessels of the heart (coronary arteries). These deposits of calcium and plaques can partly clog and narrow the coronary arteries without producing any symptoms or warning signs. This puts a person at risk for a heart attack. This test can detect these deposits before symptoms develop. Tell a health care provider about: Any allergies you have. All medicines you are taking, including vitamins, herbs, eye drops, creams, and over-the-counter medicines. Any problems you or family members have had with anesthetic  medicines. Any blood disorders you have. Any surgeries you have had. Any medical conditions you have. Whether you are pregnant or may be pregnant. What are the risks? Generally, this is a safe procedure. However, problems may occur, including: Harm to a pregnant woman and her unborn baby. This test involves the use of radiation. Radiation exposure can be dangerous to a pregnant woman and her unborn baby. If you are pregnant, you generally should not have this procedure done. Slight increase in the risk of cancer. This is because of the radiation involved in the test. What happens before the procedure? No preparation is needed for this procedure. What happens during the procedure? You will undress and remove any jewelry around your neck or chest. You will put on a hospital gown. Sticky electrodes will be placed on your chest. The electrodes will be connected to an electrocardiogram (ECG) machine to record a tracing of the electrical activity of your heart. A CT scanner will take pictures of your heart. During this time, you will be asked to lie still and hold your breath for 2-3 seconds while a picture of your heart is being taken. The procedure may vary among health care providers and hospitals. What happens after the procedure? You can get dressed. You can return to your normal activities. It is up to you to get the results of your test. Ask your health care provider, or the department that is doing the test, when your results will be ready. Summary A coronary calcium scan is an imaging test used to look for deposits of calcium and other fatty materials (plaques)  in the inner lining of the blood vessels of the heart (coronary arteries). Generally, this is a safe procedure. Tell your health care provider if you are pregnant or may be pregnant. No preparation is needed for this procedure. A CT scanner will take pictures of your heart. You can return to your normal activities after the scan  is done. This information is not intended to replace advice given to you by your health care provider. Make sure you discuss any questions you have with your health care provider. Document Released: 10/27/2007 Document Revised: 03/19/2016 Document Reviewed: 03/19/2016 Elsevier Interactive Patient Education  2017 Elsevier Inc.  Elizabeth Stevenson- Long Term Monitor Instructions  Your physician has requested you wear a ZIO patch monitor for 14 days.  This is a single patch monitor. [employer] supplies one patch monitor per enrollment. Additional stickers are not available. Please do not apply patch if you will be having a Nuclear Stress Test,  Echocardiogram, Cardiac CT, MRI, or Chest Xray during the period you would be wearing the  monitor. The patch cannot be worn during these tests. You cannot remove and re-apply the  ZIO XT patch monitor.  Your ZIO patch monitor will be mailed 3 day USPS to your address on file. It may take 3-5 days  to receive your monitor after you have been enrolled.  Once you have received your monitor, please review the enclosed instructions. Your monitor  has already been registered assigning a specific monitor serial # to you.  Billing and Patient Assistance Program Information  We have supplied [employer] with any of your insurance information on file for billing purposes. [employer] offers a sliding scale Patient Assistance Program for patients that do not have  insurance, or whose insurance does not completely cover the cost of the ZIO monitor.  You must apply for the Patient Assistance Program to qualify for this discounted rate.  To apply, please call [employer] at [phone], select option 4, select option 2, ask to apply for  Patient Assistance Program. Elizabeth Stevenson will ask your household income, and how many people  are in your household. They will quote your out-of-pocket cost based on that information.  [employer] will also be able to set up a 27-month, interest-free payment plan  if needed.  Applying the monitor   Shave hair from upper left chest.  Hold abrader disc by orange tab. Rub abrader in 40 strokes over the upper left chest as  indicated in your monitor instructions.  Clean area with 4 enclosed alcohol pads. Let dry.  Apply patch as indicated in monitor instructions. Patch will be placed under collarbone on left  side of chest with arrow pointing upward.  Rub patch adhesive wings for 2 minutes. Remove white label marked "1". Remove the white  label marked "2". Rub patch adhesive wings for 2 additional minutes.  While looking in a mirror, press and release button in center of patch. A small green light will  flash 3-4 times. This will be your only indicator that the monitor has been turned on.  Do not shower for the first 24 hours. You may shower after the first 24 hours.  Press the button if you feel a symptom. You will hear a small click. Record Date, Time and  Symptom in the Patient Logbook.  When you are ready to remove the patch, follow instructions on the last 2 pages of Patient  Logbook. Stick patch monitor onto the last page of Patient Logbook.  Place Patient Logbook in the blue  and white box. Use locking tab on box and tape box closed  securely. The blue and white box has prepaid postage on it. Please place it in the mailbox as  soon as possible. Your physician should have your test results approximately 7 days after the  monitor has been mailed back to [facility].  Call [facility] Customer Care at [phone] if you have questions regarding  your ZIO XT patch monitor. Call them immediately if you see an orange light blinking on your  monitor.  If your monitor falls off in less than 4 days, contact our Monitor department at [phone].  If your monitor becomes loose or falls off after 4 days call [employer] at [phone] for  suggestions on securing your monitor  Your physician has recommended that you have a sleep study. This test  records several body functions during sleep, including: brain activity, eye movement, oxygen and carbon dioxide blood levels, heart rate and rhythm, breathing rate and rhythm, the flow of air through your mouth and nose, snoring, body muscle movements, and chest and belly movement.    Follow-Up: At [facility], you and your health needs are our priority.  As part of our continuing mission to provide you with exceptional heart care, we have created designated Provider Care Teams.  These Care Teams include your primary Cardiologist (physician) and Advanced Practice Providers (APPs -  Physician Assistants and Nurse Practitioners) who all work together to provide you with the care you need, when you need it.  We recommend signing up for the patient portal called "MyChart".  Sign up information is provided on this After Visit Summary.  MyChart is used to connect with patients for Virtual Visits (Telemedicine).  Patients are able to view lab/test results, encounter notes, upcoming appointments, etc.  Non-urgent messages can be sent to your provider as well.   To learn more about what you can do with MyChart, go to [email or web address].    Your next appointment:   3 month(s)  The format for your next appointment:   In Person  Provider:   Nanetta Batty, MD   Other Instructions Referral made for life coach, Elizabeth Stevenson.

## 2021-02-18 LAB — LIPID PANEL
Chol/HDL Ratio: 1.9 ratio (ref 0.0–4.4)
Cholesterol, Total: 148 mg/dL (ref 100–199)
HDL: 79 mg/dL (ref >39)
LDL Chol Calc (NIH): 60 mg/dL (ref 0–99)
Triglycerides: 38 mg/dL (ref 0–149)
VLDL Cholesterol Cal: 9 mg/dL (ref 5–40)

## 2021-02-18 LAB — HEPATIC FUNCTION PANEL
ALT: 10 IU/L (ref 0–32)
AST: 14 IU/L (ref 0–40)
Albumin: 4.3 g/dL (ref 3.8–4.8)
Alkaline Phosphatase: 73 IU/L (ref 44–121)
Bilirubin Total: 0.5 mg/dL (ref 0.0–1.2)
Bilirubin, Direct: 0.12 mg/dL (ref 0.00–0.40)
Total Protein: 7.3 g/dL (ref 6.0–8.5)

## 2021-02-18 LAB — TSH+FREE T4
Free T4: 1.18 ng/dL (ref 0.82–1.77)
TSH: 1.43 u[IU]/mL (ref 0.450–4.500)

## 2021-02-21 ENCOUNTER — Telehealth: Payer: Self-pay | Admitting: *Deleted

## 2021-02-21 NOTE — Telephone Encounter (Signed)
-----   Message from Bernita Buffy, RN sent at 02/17/2021 11:14 AM EDT ----- Regarding: Home sleep study Pt needs home sleep study for OSA.  Thank you, Carma Lair

## 2021-02-21 NOTE — Telephone Encounter (Signed)
Patient notified of HST appointment. 

## 2021-02-27 ENCOUNTER — Telehealth: Payer: Self-pay | Admitting: Cardiovascular Disease

## 2021-02-27 NOTE — Telephone Encounter (Signed)
Analyce is calling stating she wore the heart monitor Dr. Allyson Sabal ordered for her Friday through Sunday, but ended up taking it off. She states it was itching and there was a burning sensation. When she took the monitor off her skin was burnt and bumpy. She is going to send the monitor back in and is wanting to know how she should proceed in regards to this.

## 2021-02-27 NOTE — Telephone Encounter (Signed)
Instructed patient to mail monitor back to [facility] to be processed.  Dr. Allyson Sabal with review any data recorded from the ZIO patch monitor.  If he needs more data, he can order the 30 day [employer] cardiac event monitor with the sensitive skin set up.  [employer] does not offer a sensitive skin monitor.  Please let your provider know you had a reaction the the ZIO patch monitor if one is ordered in the future.

## 2021-02-27 NOTE — Telephone Encounter (Signed)
Returned call to pt. She state she was only able to wear monitor for 2 days due to skin irritations. Pt is questioning if there is another monitor she can wear.

## 2021-03-15 ENCOUNTER — Ambulatory Visit (HOSPITAL_BASED_OUTPATIENT_CLINIC_OR_DEPARTMENT_OTHER): Payer: 59 | Attending: Cardiology | Admitting: Cardiovascular Disease

## 2021-03-24 ENCOUNTER — Other Ambulatory Visit: Payer: Self-pay

## 2021-03-24 ENCOUNTER — Ambulatory Visit (INDEPENDENT_AMBULATORY_CARE_PROVIDER_SITE_OTHER)
Admission: RE | Admit: 2021-03-24 | Discharge: 2021-03-24 | Disposition: A | Discharge status: Home / Self Care | Payer: Self-pay | Source: Ambulatory Visit | Attending: Cardiology | Admitting: Cardiology

## 2021-03-24 ENCOUNTER — Ambulatory Visit (HOSPITAL_COMMUNITY): Payer: 59 | Attending: Cardiology

## 2021-03-24 DIAGNOSIS — F17200 Nicotine dependence, unspecified, uncomplicated: Secondary | ICD-10-CM

## 2021-03-24 DIAGNOSIS — G4733 Obstructive sleep apnea (adult) (pediatric): Secondary | ICD-10-CM

## 2021-03-24 DIAGNOSIS — R002 Palpitations: Secondary | ICD-10-CM | POA: Diagnosis present

## 2021-03-24 LAB — ECHOCARDIOGRAM COMPLETE
Area-P 1/2: 4.06 cm2
S' Lateral: 3.5 cm

## 2021-03-27 ENCOUNTER — Ambulatory Visit: Payer: 59 | Admitting: Cardiology

## 2021-03-29 ENCOUNTER — Encounter: Payer: Self-pay | Admitting: *Deleted

## 2021-04-04 ENCOUNTER — Other Ambulatory Visit: Payer: Self-pay

## 2021-04-04 ENCOUNTER — Ambulatory Visit
Admission: EM | Admit: 2021-04-04 | Discharge: 2021-04-04 | Disposition: A | Discharge status: Home / Self Care | Payer: No Typology Code available for payment source | Attending: Physician Assistant | Admitting: Physician Assistant

## 2021-04-04 DIAGNOSIS — J069 Acute upper respiratory infection, unspecified: Secondary | ICD-10-CM | POA: Diagnosis not present

## 2021-04-04 DIAGNOSIS — R6889 Other general symptoms and signs: Secondary | ICD-10-CM

## 2021-04-04 LAB — POCT INFLUENZA A/B
Influenza A, POC: NEGATIVE
Influenza B, POC: NEGATIVE

## 2021-04-04 MED ORDER — ACETAMINOPHEN 325 MG PO TABS
650.0000 mg | ORAL_TABLET | Freq: Once | ORAL | Status: AC
  Administered 2021-04-04: 650 mg via ORAL

## 2021-04-04 MED ORDER — OSELTAMIVIR PHOSPHATE 75 MG PO CAPS: 75.0000 mg | ORAL_CAPSULE | Freq: Two times a day (BID) | ORAL | 0 refills | Status: AC

## 2021-04-04 NOTE — ED Triage Notes (Signed)
Pt c/o cough, chest congestion, headache, body aches, and fever since yesterday.

## 2021-04-04 NOTE — ED Provider Notes (Signed)
[name]    CSN: [id number] Arrival date & time: 04/04/21  1832      History   Chief Complaint Chief Complaint  Patient presents with   Cough    HPI Elizabeth Stevenson is a 35 y.o. female.   Here today for evaluation of congestion, cough, fever and body aches that started yesterday.  She has taken over-the-counter medication without significant relief.  She reports some pain in her chest with cough.  She does not report any vomiting or diarrhea.  The history is provided by the patient.  Cough Associated symptoms: fever and myalgias   Associated symptoms: no ear pain, no eye discharge, no shortness of breath, no sore throat and no wheezing    Past Medical History:  Diagnosis Date   Abnormal Pap smear    Chlamydia    GERD (gastroesophageal reflux disease)    HSV-1 (herpes simplex virus 1) infection    HSV-2 (herpes simplex virus 2) infection    Migraine    Ovarian cyst    Pelvic pain    Ventricular tachycardia     Patient Active Problem List   Diagnosis Date Noted   Obstructive sleep apnea 02/17/2021   Elevated CK 11/29/2016   Chronic daily headache 09/22/2015   Tobacco use disorder 03/23/2015   Tonsil symptom 03/23/2015   Routine general medical examination at a health care facility 03/23/2015   Back pain 03/23/2015   Numbness 08/28/2014   Viral illness 08/28/2014   Morbid obesity (HCC) 02/19/2014   Leg skin lesion, left 01/21/2014   Migraine 02/25/2013   Allergic rhinitis 02/25/2013   Palpitations 04/29/2012   HSV-1 (herpes simplex virus 1) infection     Past Surgical History:  Procedure Laterality Date   COLPOSCOPY  2008   has had procedure done couple times   MOUTH SURGERY      OB History     Gravida  3   Para  1   Term  1   Preterm  0   AB  2   Living  1      SAB  1   IAB  1   Ectopic  0   Multiple  0   Live Births               Home Medications    Prior to Admission medications   Medication Sig  Start Date End Date Taking? Authorizing Provider  oseltamivir (TAMIFLU) 75 MG capsule Take 1 capsule (75 mg total) by mouth every 12 (twelve) hours. 04/04/21  Yes Tomi Bamberger, PA-C  BLACK CURRANT SEED OIL PO Take by mouth.    [provider]  cetirizine (ZYRTEC) 10 MG tablet cetirizine 10 mg tablet    [provider]  Cholecalciferol 1.25 MG (50000 UT) capsule cholecalciferol (vitamin D3) 1,250 mcg (50,000 unit) capsule  TAKE 1 CAPSULE BY MOUTH ONCE A WEEK    [provider]  eletriptan (RELPAX) 40 MG tablet Take 1 tablet (40 mg total) by mouth as needed for migraine or headache. May repeat in 2 hours if headache persists or recurs. 07/21/18   Patel, Roxana Hires K, DO  MAGNESIUM PO Take by mouth.    [provider]  methocarbamol (ROBAXIN) 500 MG tablet Take 1 tablet (500 mg total) by mouth 2 (two) times daily. 07/22/18   Mardella Layman, MD  Multiple Vitamins-Minerals (ZINC PO) Take by mouth.    [provider]  Turmeric (QC TUMERIC COMPLEX PO) Take by mouth.  [provider]    Family History Family History  Problem Relation Age of Onset   Hypertension Mother        Living   Hyperlipidemia Father    Hypertension Father    Hypertension Maternal Grandmother    Stroke Maternal Grandmother    Diabetes Maternal Grandfather    Diabetes Paternal Grandmother    Healthy Daughter     Social History Social History   Tobacco Use   Smoking status: Former    Types: Cigars    Quit date: 02/11/2014    Years since quitting: 7.1   Smokeless tobacco: Never   Tobacco comments:    quit last week  Vaping Use   Vaping Use: Never used  Substance Use Topics   Alcohol use: Yes    Alcohol/week: 0.0 standard drinks    Comment: 1 glass of wine, 2-3 times week   Drug use: No     Allergies   Rondec [chlorpheniramine-pseudoeph]   Review of Systems Review of Systems  Constitutional:  Positive for fever.  HENT:  Positive for congestion and  sinus pressure. Negative for ear pain and sore throat.   Eyes:  Negative for discharge and redness.  Respiratory:  Positive for cough. Negative for shortness of breath and wheezing.   Gastrointestinal:  Negative for abdominal pain, diarrhea, nausea and vomiting.  Musculoskeletal:  Positive for myalgias.    Physical Exam Triage Vital Signs ED Triage Vitals  Enc Vitals Group     BP 04/04/21 1914 127/82     Pulse Rate 04/04/21 1914 (!) 108     Resp 04/04/21 1914 18     Temp 04/04/21 1914 (!) 101.9 F (38.8 C)     Temp Source 04/04/21 1914 Oral     SpO2 04/04/21 1914 97 %     Weight --      Height --      Head Circumference --      Peak Flow --      Pain Score 04/04/21 1915 7     Pain Loc --      Pain Edu? --      Excl. in [place]? --    No data found.  Updated Vital Signs BP 127/82 (BP Location: Right Arm)   Pulse (!) 108   Temp (!) 101.9 F (38.8 C) (Oral)   Resp 18   LMP 03/22/2021   SpO2 97%      Physical Exam Vitals and nursing note reviewed.  Constitutional:      General: She is not in acute distress.    Appearance: Normal appearance. She is not ill-appearing.  HENT:     Head: Normocephalic and atraumatic.     Nose: Congestion present.  Eyes:     Conjunctiva/sclera: Conjunctivae normal.  Cardiovascular:     Rate and Rhythm: Normal rate and regular rhythm.     Heart sounds: Normal heart sounds. No murmur heard. Pulmonary:     Effort: Pulmonary effort is normal. No respiratory distress.     Breath sounds: Normal breath sounds. No wheezing, rhonchi or rales.  Skin:    General: Skin is warm and dry.  Neurological:     Mental Status: She is alert.  Psychiatric:        Mood and Affect: Mood normal.        Thought Content: Thought content normal.     UC Treatments / Results  Labs (all labs ordered are listed, but only abnormal results are displayed) Labs Reviewed  COVID-19, FLU A+B AND RSV  POCT INFLUENZA A/B    EKG   Radiology No results  found.  Procedures Procedures (including critical care time)  Medications Ordered in UC Medications  acetaminophen (TYLENOL) tablet 650 mg (650 mg Oral Given 04/04/21 1932)    Initial Impression / Assessment and Plan / UC Course  I have reviewed the triage vital signs and the nursing notes.  Pertinent labs & imaging results that were available during my care of the patient were reviewed by me and considered in my medical decision making (see chart for details).    Rapid flu test negative.  Given flulike symptoms will treat with Tamiflu and will order screening for COVID and RSV as well.  Encouraged follow-up if symptoms fail to improve or worsen any way.  Final Clinical Impressions(s) / UC Diagnoses   Final diagnoses:  Flu-like symptoms  Acute upper respiratory infection   Discharge Instructions   None    ED Prescriptions     Medication Sig Dispense Auth. Provider   oseltamivir (TAMIFLU) 75 MG capsule Take 1 capsule (75 mg total) by mouth every 12 (twelve) hours. 10 capsule Francene Finders, PA-C      PDMP not reviewed this encounter.   Francene Finders, PA-C 04/04/21 2021

## 2021-04-06 LAB — COVID-19, FLU A+B AND RSV
Influenza A, NAA: DETECTED — AB
Influenza B, NAA: NOT DETECTED
RSV, NAA: NOT DETECTED
SARS-CoV-2, NAA: NOT DETECTED

## 2021-04-25 ENCOUNTER — Telehealth: Payer: Self-pay

## 2021-04-25 NOTE — Telephone Encounter (Signed)
Letter has been sent to patient instructing them to call us if they are still interested in completing their sleep study. If we have not received a response from the patient within 30 days of this notice, the order will be cancelled and they will need to discuss the need for a sleep study at their next office visit.  ° °

## 2021-06-25 ENCOUNTER — Encounter (HOSPITAL_COMMUNITY): Payer: Self-pay | Admitting: *Deleted

## 2021-06-25 ENCOUNTER — Other Ambulatory Visit: Payer: Self-pay

## 2021-06-25 ENCOUNTER — Emergency Department (HOSPITAL_COMMUNITY): Payer: No Typology Code available for payment source

## 2021-06-25 ENCOUNTER — Emergency Department (HOSPITAL_COMMUNITY)
Admission: EM | Admit: 2021-06-25 | Discharge: 2021-06-25 | Disposition: A | Discharge status: Home / Self Care | Payer: No Typology Code available for payment source | Attending: Emergency Medicine | Admitting: Emergency Medicine

## 2021-06-25 DIAGNOSIS — Z87891 Personal history of nicotine dependence: Secondary | ICD-10-CM | POA: Insufficient documentation

## 2021-06-25 DIAGNOSIS — R519 Headache, unspecified: Secondary | ICD-10-CM | POA: Insufficient documentation

## 2021-06-25 DIAGNOSIS — K0889 Other specified disorders of teeth and supporting structures: Secondary | ICD-10-CM | POA: Insufficient documentation

## 2021-06-25 LAB — BASIC METABOLIC PANEL
Anion gap: 8 (ref 5–15)
BUN: 9 mg/dL (ref 6–20)
CO2: 23 mmol/L (ref 22–32)
Calcium: 9.1 mg/dL (ref 8.9–10.3)
Chloride: 109 mmol/L (ref 98–111)
Creatinine, Ser: 1 mg/dL (ref 0.44–1.00)
GFR, Estimated: >60 mL/min (ref >60)
Glucose, Bld: 86 mg/dL (ref 70–99)
Potassium: 3.8 mmol/L (ref 3.5–5.1)
Sodium: 140 mmol/L (ref 135–145)

## 2021-06-25 LAB — CBC
HCT: 40.6 % (ref 36.0–46.0)
Hemoglobin: 13.6 g/dL (ref 12.0–15.0)
MCH: 32.7 pg (ref 26.0–34.0)
MCHC: 33.5 g/dL (ref 30.0–36.0)
MCV: 97.6 fL (ref 80.0–100.0)
Platelets: 285 10*3/uL (ref 150–400)
RBC: 4.16 MIL/uL (ref 3.87–5.11)
RDW: 13.1 % (ref 11.5–15.5)
WBC: 6.3 10*3/uL (ref 4.0–10.5)
nRBC: 0 % (ref 0.0–0.2)

## 2021-06-25 LAB — I-STAT BETA HCG BLOOD, ED (MC, WL, AP ONLY): I-stat hCG, quantitative: <5 m[IU]/mL (ref <5)

## 2021-06-25 MED ORDER — KETOROLAC TROMETHAMINE 15 MG/ML IJ SOLN
15.0000 mg | Freq: Once | INTRAMUSCULAR | Status: AC
  Administered 2021-06-25: 15 mg via INTRAVENOUS
  Filled 2021-06-25: qty 1

## 2021-06-25 MED ORDER — CLINDAMYCIN HCL 150 MG PO CAPS: 150.0000 mg | ORAL_CAPSULE | Freq: Four times a day (QID) | ORAL | 0 refills | Status: AC

## 2021-06-25 MED ORDER — IOHEXOL 300 MG/ML  SOLN
100.0000 mL | Freq: Once | INTRAMUSCULAR | Status: AC | PRN
  Administered 2021-06-25: 100 mL via INTRAVENOUS

## 2021-06-25 MED ORDER — NAPROXEN 500 MG PO TABS: 500.0000 mg | ORAL_TABLET | Freq: Two times a day (BID) | ORAL | 0 refills | Status: AC

## 2021-06-25 MED ORDER — FLUCONAZOLE 150 MG PO TABS: 150.0000 mg | ORAL_TABLET | ORAL | 0 refills | Status: DC | PRN

## 2021-06-25 MED ORDER — DIPHENHYDRAMINE HCL 50 MG/ML IJ SOLN
25.0000 mg | Freq: Once | INTRAMUSCULAR | Status: DC
  Filled 2021-06-25: qty 1

## 2021-06-25 MED ORDER — SODIUM CHLORIDE 0.9 % IV BOLUS
1000.0000 mL | Freq: Once | INTRAVENOUS | Status: AC
  Administered 2021-06-25: 1000 mL via INTRAVENOUS

## 2021-06-25 MED ORDER — PROCHLORPERAZINE EDISYLATE 10 MG/2ML IJ SOLN
10.0000 mg | Freq: Once | INTRAMUSCULAR | Status: DC
  Filled 2021-06-25: qty 2

## 2021-06-25 NOTE — Discharge Instructions (Signed)
You were evaluated in the Emergency Department and after careful evaluation, we did not find any emergent condition requiring admission or further testing in the hospital.  Your exam/testing today was overall reassuring.  Symptoms likely due to a tooth infection with some surrounding inflammation.  Take the clindamycin antibiotic as directed.  Use the Naprosyn anti-inflammatory for pain.  Follow-up with a dentist.  Please return to the Emergency Department if you experience any worsening of your condition.  Thank you for allowing [place] to be a part of your care.

## 2021-06-25 NOTE — ED Notes (Signed)
Patient transported to CT 

## 2021-06-25 NOTE — ED Triage Notes (Signed)
Pt has been having ongoing dental pain since October. Reports for the past month pain has worsened. Has seen her dentist who referred who to a oral surgeron (appt in May). Pain uncontrolled with tylenol and motrin at home. Now reports sore throat and concerns for abscess

## 2021-06-25 NOTE — ED Provider Notes (Signed)
[facility] Emergency Department Provider Note MRN:  [id number]  Arrival date & time: 06/25/21     Chief Complaint   Dental Pain   History of Present Illness   Elizabeth Stevenson is a 36 y.o. year-old female with no pertinent past medical presenting to the ED with chief complaint of dental pain.  Pain to the left mandibular second molar ongoing for months, significantly worsening over the past few weeks.  Feels a foreign body sensation in her throat.  Pain in the tooth is causing daily headaches when she wakes up.  Trouble sleeping due to the tooth pain at night.  Denies fever.  Thinks that she has an abscess.  Review of Systems  A thorough review of systems was obtained and all systems are negative except as noted in the HPI and PMH.   Patient's Health History    Past Medical History:  Diagnosis Date   Abnormal Pap smear    Chlamydia    GERD (gastroesophageal reflux disease)    HSV-1 (herpes simplex virus 1) infection    HSV-2 (herpes simplex virus 2) infection    Migraine    Ovarian cyst    Pelvic pain    Ventricular tachycardia     Past Surgical History:  Procedure Laterality Date   COLPOSCOPY  2008   has had procedure done couple times   MOUTH SURGERY      Family History  Problem Relation Age of Onset   Hypertension Mother        Living   Hyperlipidemia Father    Hypertension Father    Hypertension Maternal Grandmother    Stroke Maternal Grandmother    Diabetes Maternal Grandfather    Diabetes Paternal Grandmother    Healthy Daughter     Social History   Socioeconomic History   Marital status: Single    Spouse name: Not on file   Number of children: 1   Years of education: Not on file   Highest education level: Not on file  Occupational History   Not on file  Tobacco Use   Smoking status: Former    Types: Cigars    Quit date: 02/11/2014    Years since quitting: 7.3   Smokeless tobacco: Never   Tobacco comments:    quit  last week  Vaping Use   Vaping Use: Never used  Substance and Sexual Activity   Alcohol use: Yes    Alcohol/week: 0.0 standard drinks    Comment: 1 glass of wine, 2-3 times week   Drug use: No   Sexual activity: Yes    Birth control/protection: Implant  Other Topics Concern   Not on file  Social History Narrative      Highest level of education:  Some college   Caffeine Use-yes   Regular exercise-no   Social Determinants of Health   Financial Resource Strain: Not on file  Food Insecurity: Not on file  Transportation Needs: Not on file  Physical Activity: Not on file  Stress: Not on file  Social Connections: Not on file  Intimate Partner Violence: Not on file     Physical Exam   Vitals:   06/25/21 0129  BP: (!) 135/104  Pulse: 94  Resp: 17  Temp: 98.3 F (36.8 C)  SpO2: 100%    CONSTITUTIONAL: Well-appearing, NAD NEURO/PSYCH:  Alert and oriented x 3, no focal deficits EYES:  eyes equal and reactive ENT/NECK:  no LAD, no JVD; tender to palpation to the left mandibular  second molar CARDIO: Regular rate, well-perfused, normal S1 and S2 PULM:  CTAB no wheezing or rhonchi GI/GU:  non-distended, non-tender MSK/SPINE:  No gross deformities, no edema SKIN:  no rash, atraumatic   *Additional and/or pertinent findings included in MDM below  Diagnostic and Interventional Summary    EKG Interpretation  Date/Time:    Ventricular Rate:    PR Interval:    QRS Duration:   QT Interval:    QTC Calculation:   R Axis:     Text Interpretation:         Labs Reviewed  CBC  BASIC METABOLIC PANEL  I-STAT BETA HCG BLOOD, ED ([facility], [facility], [facility] ONLY)    CT Soft Tissue Neck W Contrast  Final Result    CT HEAD WO CONTRAST ( )  Final Result      Medications  diphenhydrAMINE (BENADRYL) injection 25 mg (25 mg Intravenous Not Given 06/25/21 0258)  prochlorperazine (COMPAZINE) injection 10 mg (10 mg Intravenous Not Given 06/25/21 5277)  ketorolac (TORADOL) 15 MG/ML injection  15 mg (15 mg Intravenous Given 06/25/21 0210)  sodium chloride 0.9 % bolus 1,000 mL (0 mLs Intravenous Stopped 06/25/21 0359)  iohexol (OMNIPAQUE) 300 MG/ML solution 100 mL (100 mLs Intravenous Contrast Given 06/25/21 0449)     Procedures  /  Critical Care Procedures  ED Course and Medical Decision Making  Initial Impression and Ddx Favoring tooth infection with surrounding inflammation and pain however patient convinced that there is an abscess or something in her throat.  Shared decision making utilized, will CT to exclude deeper space infection or abscess, contiguous odontogenic spread of infection.  Past medical/surgical history that increases complexity of ED encounter: None  Interpretation of Diagnostics Labs overall reassuring with no significant blood count or electrolyte disturbance.  CT imaging is largely unremarkable  Patient Reassessment and Ultimate Disposition/Management Patient feeling a lot better after Toradol, appropriate for discharge.  Patient management required discussion with the following services or consulting groups:  None  Complexity of Problems Addressed Acute complicated illness or Injury  Additional Data Reviewed and Analyzed Further history obtained from: None  Additional Factors Impacting ED Encounter Risk Prescriptions  Elmer Sow. Pilar Plate, MD [facility] Emergency Medicine [facility] [email or web address]  Final Clinical Impressions(s) / ED Diagnoses     ICD-10-CM   1. Pain, dental  K08.89       ED Discharge Orders          Ordered    naproxen (NAPROSYN) 500 MG tablet  2 times daily        06/25/21 0537    clindamycin (CLEOCIN) 150 MG capsule  Every 6 hours        06/25/21 0537             Discharge Instructions Discussed with and Provided to Patient:     Discharge Instructions      You were evaluated in the Emergency Department and after careful evaluation, we did not find any emergent condition requiring  admission or further testing in the hospital.  Your exam/testing today was overall reassuring.  Symptoms likely due to a tooth infection with some surrounding inflammation.  Take the clindamycin antibiotic as directed.  Use the Naprosyn anti-inflammatory for pain.  Follow-up with a dentist.  Please return to the Emergency Department if you experience any worsening of your condition.  Thank you for allowing [place] to be a part of your care.        Sabas Sous, MD 06/25/21 [phone]

## 2021-10-24 ENCOUNTER — Ambulatory Visit (INDEPENDENT_AMBULATORY_CARE_PROVIDER_SITE_OTHER): Payer: Commercial Managed Care - PPO

## 2021-10-24 ENCOUNTER — Encounter: Payer: Self-pay | Admitting: Emergency Medicine

## 2021-10-24 ENCOUNTER — Other Ambulatory Visit: Payer: Self-pay

## 2021-10-24 ENCOUNTER — Ambulatory Visit
Admission: EM | Admit: 2021-10-24 | Discharge: 2021-10-24 | Disposition: A | Discharge status: Home / Self Care | Payer: Commercial Managed Care - PPO | Attending: Internal Medicine | Admitting: Internal Medicine

## 2021-10-24 DIAGNOSIS — R053 Chronic cough: Secondary | ICD-10-CM

## 2021-10-24 DIAGNOSIS — R059 Cough, unspecified: Secondary | ICD-10-CM | POA: Diagnosis not present

## 2021-10-24 MED ORDER — BENZONATATE 100 MG PO CAPS: 100.0000 mg | ORAL_CAPSULE | Freq: Three times a day (TID) | ORAL | 0 refills | Status: AC | PRN

## 2021-10-24 MED ORDER — PREDNISONE 10 MG (21) PO TBPK: ORAL_TABLET | Freq: Every day | ORAL | 0 refills | Status: DC

## 2021-10-24 NOTE — ED Triage Notes (Signed)
Pt here for cough and congestion x 3 weeks since having URI sx; all sx resolved except for cough

## 2021-10-24 NOTE — ED Provider Notes (Signed)
[name]    CSN: [id number] Arrival date & time: 10/24/21  1824      History   Chief Complaint Chief Complaint  Patient presents with   Cough    HPI Elizabeth Stevenson is a 36 y.o. female.   Patient presents with cough that has been present for approximately 3 weeks.  Patient reports that she had an viral upper respiratory infection with nasal congestion and cough at start of symptoms.  She reports all symptoms have resolved except for persistent and harsh cough.  Denies any associated chest pain, shortness of breath, sore throat, ear pain, nausea, vomiting, diarrhea, abdominal pain.  Patient has not been seen by healthcare provider since symptoms started.  Denies any known fevers.  Her daughter had similar symptoms when symptoms first started as well.  Denies history of asthma or COPD.    Cough   Past Medical History:  Diagnosis Date   Abnormal Pap smear    Chlamydia    GERD (gastroesophageal reflux disease)    HSV-1 (herpes simplex virus 1) infection    HSV-2 (herpes simplex virus 2) infection    Migraine    Ovarian cyst    Pelvic pain    Ventricular tachycardia [facility])     Patient Active Problem List   Diagnosis Date Noted   Obstructive sleep apnea 02/17/2021   Elevated CK 11/29/2016   Chronic daily headache 09/22/2015   Tobacco use disorder 03/23/2015   Tonsil symptom 03/23/2015   Routine general medical examination at a health care facility 03/23/2015   Back pain 03/23/2015   Numbness 08/28/2014   Viral illness 08/28/2014   Morbid obesity ([place]) 02/19/2014   Leg skin lesion, left 01/21/2014   Migraine 02/25/2013   Allergic rhinitis 02/25/2013   Palpitations 04/29/2012   HSV-1 (herpes simplex virus 1) infection     Past Surgical History:  Procedure Laterality Date   COLPOSCOPY  2008   has had procedure done couple times   MOUTH SURGERY      OB History     Gravida  3   Para  1   Term  1   Preterm  0   AB  2   Living  1       SAB  1   IAB  1   Ectopic  0   Multiple  0   Live Births               Home Medications    Prior to Admission medications   Medication Sig Start Date End Date Taking? Authorizing Provider  benzonatate (TESSALON) 100 MG capsule Take 1 capsule (100 mg total) by mouth every 8 (eight) hours as needed for cough. 10/24/21  Yes Charron Coultas, Hildred Alamin E, FNP  predniSONE (STERAPRED UNI-PAK 21 TAB) 10 MG (21) TBPK tablet Take by mouth daily. Take 6 tabs by mouth daily  for 2 days, then 5 tabs for 2 days, then 4 tabs for 2 days, then 3 tabs for 2 days, 2 tabs for 2 days, then 1 tab by mouth daily for 2 days 10/24/21  Yes Ceanna Wareing, [place] E, FNP  BLACK CURRANT SEED OIL PO Take by mouth.    [provider]  cetirizine (ZYRTEC) 10 MG tablet cetirizine 10 mg tablet    [provider]  Cholecalciferol 1.25 MG (50000 UT) capsule cholecalciferol (vitamin D3) 1,250 mcg (50,000 unit) capsule  TAKE 1 CAPSULE BY MOUTH ONCE A WEEK    [provider]  eletriptan (RELPAX)  40 MG tablet Take 1 tablet (40 mg total) by mouth as needed for migraine or headache. May repeat in 2 hours if headache persists or recurs. 07/21/18   Patel, Donika K, DO  fluconazole (DIFLUCAN) 150 MG tablet Take 1 tablet (150 mg total) by mouth every three (3) days as needed. Patient not taking: Reported on 10/24/2021 06/25/21   Sabas Sous, MD  MAGNESIUM PO Take by mouth.    [provider]  methocarbamol (ROBAXIN) 500 MG tablet Take 1 tablet (500 mg total) by mouth 2 (two) times daily. 07/22/18   Mardella Layman, MD  Multiple Vitamins-Minerals (ZINC PO) Take by mouth.    [provider]  naproxen (NAPROSYN) 500 MG tablet Take 1 tablet (500 mg total) by mouth 2 (two) times daily. 06/25/21   Sabas Sous, MD  oseltamivir (TAMIFLU) 75 MG capsule Take 1 capsule (75 mg total) by mouth every 12 (twelve) hours. Patient not taking: Reported on 10/24/2021 04/04/21   Tomi Bamberger, PA-C  Turmeric (QC TUMERIC  COMPLEX PO) Take by mouth.    [provider]    Family History Family History  Problem Relation Age of Onset   Hypertension Mother        Living   Hyperlipidemia Father    Hypertension Father    Hypertension Maternal Grandmother    Stroke Maternal Grandmother    Diabetes Maternal Grandfather    Diabetes Paternal Grandmother    Healthy Daughter     Social History Social History   Tobacco Use   Smoking status: Former    Types: Cigars    Quit date: 02/11/2014    Years since quitting: 7.7   Smokeless tobacco: Never   Tobacco comments:    quit last week  Vaping Use   Vaping Use: Never used  Substance Use Topics   Alcohol use: Yes    Alcohol/week: 0.0 standard drinks of alcohol    Comment: 1 glass of wine, 2-3 times week   Drug use: No     Allergies   Rondec [chlorpheniramine-pseudoeph]   Review of Systems Review of Systems Per HPI  Physical Exam Triage Vital Signs ED Triage Vitals [10/24/21 1903]  Enc Vitals Group     BP (!) 144/85     Pulse Rate 61     Resp 20     Temp 98.5 F (36.9 C)     Temp Source Oral     SpO2 98 %     Weight      Height      Head Circumference      Peak Flow      Pain Score 0     Pain Loc      Pain Edu?      Excl. in [place]?    No data found.  Updated Vital Signs BP (!) 144/85 (BP Location: Left Arm)   Pulse 61   Temp 98.5 F (36.9 C) (Oral)   Resp 20   SpO2 98%   Visual Acuity Right Eye Distance:   Left Eye Distance:   Bilateral Distance:    Right Eye Near:   Left Eye Near:    Bilateral Near:     Physical Exam Constitutional:      General: She is not in acute distress.    Appearance: Normal appearance. She is not toxic-appearing or diaphoretic.  HENT:     Head: Normocephalic and atraumatic.     Right Ear: Tympanic membrane and ear canal normal.  Left Ear: Tympanic membrane and ear canal normal.     Nose: No congestion.     Mouth/Throat:     Mouth: Mucous membranes are moist.     Pharynx: No  posterior oropharyngeal erythema.  Eyes:     Extraocular Movements: Extraocular movements intact.     Conjunctiva/sclera: Conjunctivae normal.     Pupils: Pupils are equal, round, and reactive to light.  Cardiovascular:     Rate and Rhythm: Normal rate and regular rhythm.     Pulses: Normal pulses.     Heart sounds: Normal heart sounds.  Pulmonary:     Effort: Pulmonary effort is normal. No respiratory distress.     Breath sounds: Normal breath sounds. No stridor. No wheezing, rhonchi or rales.  Abdominal:     General: Abdomen is flat. Bowel sounds are normal.     Palpations: Abdomen is soft.  Musculoskeletal:        General: Normal range of motion.     Cervical back: Normal range of motion.  Skin:    General: Skin is warm and dry.  Neurological:     General: No focal deficit present.     Mental Status: She is alert and oriented to person, place, and time. Mental status is at baseline.  Psychiatric:        Mood and Affect: Mood normal.        Behavior: Behavior normal.      UC Treatments / Results  Labs (all labs ordered are listed, but only abnormal results are displayed) Labs Reviewed - No data to display  EKG   Radiology DG Chest 2 View  Result Date: 10/24/2021 CLINICAL DATA:  Persistent cough EXAM: CHEST - 2 VIEW COMPARISON:  09/05/2015 FINDINGS: Cardiac and mediastinal contours are within normal limits. No focal pulmonary opacity. No pleural effusion or pneumothorax. No acute osseous abnormality. IMPRESSION: No acute cardiopulmonary process. Electronically Signed   By: Merilyn Baba M.D.   On: 10/24/2021 19:28    Procedures Procedures (including critical care time)  Medications Ordered in UC Medications - No data to display  Initial Impression / Assessment and Plan / UC Course  I have reviewed the triage vital signs and the nursing notes.  Pertinent labs & imaging results that were available during my care of the patient were reviewed by me and considered in  my medical decision making (see chart for details).     Chest x-ray was negative for any acute cardiopulmonary process.  Suspect acute bronchitis given persistent cough.  Will treat with prednisone steroid and benzonatate for cough.  Do not think that antibiotic therapy is warranted given negative chest x-ray and cough is only persistent symptom.  Patient was given strict return and ER precautions.  Patient verbalized understanding and was agreeable with plan. Final Clinical Impressions(s) / UC Diagnoses   Final diagnoses:  Persistent cough for 3 weeks or longer     Discharge Instructions      Chest x-ray was normal.  I am suspicious that you may have bronchitis.  You are being treated with prednisone and cough medication.  Please follow-up if symptoms persist or worsen.    ED Prescriptions     Medication Sig Dispense Auth. Provider   predniSONE (STERAPRED UNI-PAK 21 TAB) 10 MG (21) TBPK tablet Take by mouth daily. Take 6 tabs by mouth daily  for 2 days, then 5 tabs for 2 days, then 4 tabs for 2 days, then 3 tabs for 2 days, 2 tabs for  2 days, then 1 tab by mouth daily for 2 days 42 tablet [place], [place], [place]   benzonatate (TESSALON) 100 MG capsule Take 1 capsule (100 mg total) by mouth every 8 (eight) hours as needed for cough. 21 capsule [place], Michele Rockers, [place]      PDMP not reviewed this encounter.   Teodora Medici,  10/24/21 1947

## 2021-10-24 NOTE — Discharge Instructions (Addendum)
Chest x-ray was normal.  I am suspicious that you may have bronchitis.  You are being treated with prednisone and cough medication.  Please follow-up if symptoms persist or worsen.

## 2021-12-03 ENCOUNTER — Encounter (HOSPITAL_COMMUNITY): Payer: Self-pay

## 2021-12-03 ENCOUNTER — Other Ambulatory Visit: Payer: Self-pay

## 2021-12-03 ENCOUNTER — Emergency Department (HOSPITAL_COMMUNITY): Payer: Commercial Managed Care - PPO

## 2021-12-03 ENCOUNTER — Emergency Department (HOSPITAL_COMMUNITY)
Admission: EM | Admit: 2021-12-03 | Discharge: 2021-12-03 | Disposition: A | Discharge status: Home / Self Care | Payer: Commercial Managed Care - PPO | Attending: Emergency Medicine | Admitting: Emergency Medicine

## 2021-12-03 DIAGNOSIS — R059 Cough, unspecified: Secondary | ICD-10-CM | POA: Diagnosis not present

## 2021-12-03 DIAGNOSIS — Z5321 Procedure and treatment not carried out due to patient leaving prior to being seen by health care provider: Secondary | ICD-10-CM | POA: Insufficient documentation

## 2021-12-03 DIAGNOSIS — Z20822 Contact with and (suspected) exposure to covid-19: Secondary | ICD-10-CM | POA: Diagnosis not present

## 2021-12-03 DIAGNOSIS — R0981 Nasal congestion: Secondary | ICD-10-CM | POA: Insufficient documentation

## 2021-12-03 LAB — SARS CORONAVIRUS 2 BY RT PCR: SARS Coronavirus 2 by RT PCR: NEGATIVE

## 2021-12-03 NOTE — ED Provider Triage Note (Signed)
Emergency Medicine Provider Triage Evaluation Note  Elizabeth Stevenson , Stevenson 36 y.o. female  was evaluated in triage.  Pt complains of nasal congestion onset 5 days. Has associated cough. Has tried afrin with mild relief of her symptoms. Was recently on [place] plane to the [place] prior to the onset of her symptoms. Denies fever, sore throat, rhinorrhea.   Review of Systems  Positive: As per HPI Negative:   Physical Exam  BP 110/78 (BP Location: Left Arm)   Pulse 90   Temp 98.2 F (36.8 C) (Oral)   Resp 20   Ht 5\' 4"  (1.626 m)   Wt 113.9 kg   SpO2 95%   BMI 43.08 kg/m  Gen:   Awake, no distress   Resp:  Normal effort  MSK:   Moves extremities without difficulty  Other:  Uvula midline without swelling. Patent airway. Able to speak in clear complete sentences.   Medical Decision Making  Medically screening exam initiated at 1:40 PM.  Appropriate orders placed.  Denys Elizabeth Stevenson was informed that the remainder of the evaluation will be completed by another provider, this initial triage assessment does not replace that evaluation, and the importance of remaining in the ED until their evaluation is complete.  COVID swab ordered. Work-up initiated.    Elizabeth Ergle A, PA-C 12/03/21 1348

## 2021-12-03 NOTE — ED Triage Notes (Signed)
Nasal congesetion, cough, sob no fevers no sore throat.

## 2021-12-03 NOTE — ED Notes (Signed)
Pt called 3x, no answer.  

## 2021-12-03 NOTE — ED Notes (Signed)
Patient refused vitals.

## 2021-12-07 ENCOUNTER — Ambulatory Visit (INDEPENDENT_AMBULATORY_CARE_PROVIDER_SITE_OTHER): Payer: No Typology Code available for payment source | Admitting: Bariatrics

## 2021-12-20 ENCOUNTER — Encounter (INDEPENDENT_AMBULATORY_CARE_PROVIDER_SITE_OTHER): Payer: Self-pay

## 2021-12-21 ENCOUNTER — Ambulatory Visit (INDEPENDENT_AMBULATORY_CARE_PROVIDER_SITE_OTHER): Payer: Self-pay | Admitting: Bariatrics

## 2022-02-20 ENCOUNTER — Telehealth: Payer: Self-pay | Admitting: Cardiovascular Disease

## 2022-02-20 NOTE — Telephone Encounter (Signed)
Returned call to patient- patient states she had a procedure yesterday and stopped breathing during procedure-had to do a "maneuver" on her.   MD said she needed a sleep study. She is requesting a new order for sleep study since the order has expired (originally ordered a year ago).   Advised patient overdue for follow up and recommended scheduling appointment.  Patient states she has 2 jobs and is unsure she can come to an appointment.    Advised will send to Dr. Gwenlyn Found to review as well as sleep coordinator.  May need up to date face to face visit prior to ordering new study.

## 2022-02-20 NOTE — Telephone Encounter (Signed)
New Message:     Patient says she needs a new order for Sleep Study. Her old order have expired. She needs this asap please. She had a procedure  yesterday, she stopped breathing. Her doctor recommend that she have her Sleep Study as she was supposed to have had earlier.

## 2022-02-20 NOTE — Telephone Encounter (Signed)
Patient will need updated face to face office visit to document symptoms and order of sleep study. Message will be sent to [facility] for scheduling.

## 2022-02-22 ENCOUNTER — Telehealth: Payer: Self-pay | Admitting: Cardiovascular Disease

## 2022-02-22 NOTE — Telephone Encounter (Signed)
She needs appointment to be seen by Dr Claiborne Billings if it is for sleep.

## 2022-02-22 NOTE — Telephone Encounter (Signed)
Spoke with patient of Dr. Gwenlyn Found. She was referred for a sleep study last year, but never completed this. She said she had a procedure on Monday this week and stopped breathing and was told she needed a sleep study ASAP. Her referral from last year expired last week. She would like to get an updated referral. Unsure if she needs a visit for this or if MD can renew.   Will send to Dr. Gwenlyn Found and Rexanne Mano

## 2022-02-22 NOTE — Telephone Encounter (Signed)
Patient needs an updated gen card visit to document symptoms for her sleep apnea so that a new sleep study referral can be placed. She states that she cannot take anymore time off of work and wants to know if a virtual would be OK. I'm not sure if an APP would be willing to do a virtual since she has not been seen in over a year. Please advise.

## 2022-02-26 NOTE — Telephone Encounter (Signed)
She will need office visit first to document symptoms. Sleep study cannot be ordered prior to updated office visit to document symptoms. Mention of new sleep study needs to be in the note as well. The provider will request whichever study they want at the visit.

## 2022-02-28 NOTE — Telephone Encounter (Signed)
Left message for pt to call back to schedule office visit with Dr. Gwenlyn Found.

## 2022-03-08 NOTE — Telephone Encounter (Addendum)
Spoke with pt regarding office visit needed to order sleep study. Offered to make virtual visit with APP. Pt refused saying that she had her PCP refer her to another provider that does sleep medicine.  Will close this encounter.

## 2022-03-31 ENCOUNTER — Encounter: Payer: Self-pay | Admitting: Emergency Medicine

## 2022-03-31 ENCOUNTER — Ambulatory Visit
Admission: EM | Admit: 2022-03-31 | Discharge: 2022-03-31 | Disposition: A | Discharge status: Home / Self Care | Payer: Commercial Managed Care - PPO | Attending: Internal Medicine | Admitting: Internal Medicine

## 2022-03-31 DIAGNOSIS — J069 Acute upper respiratory infection, unspecified: Secondary | ICD-10-CM

## 2022-03-31 DIAGNOSIS — J029 Acute pharyngitis, unspecified: Secondary | ICD-10-CM

## 2022-03-31 DIAGNOSIS — Z87891 Personal history of nicotine dependence: Secondary | ICD-10-CM | POA: Diagnosis not present

## 2022-03-31 DIAGNOSIS — Z20822 Contact with and (suspected) exposure to covid-19: Secondary | ICD-10-CM | POA: Diagnosis not present

## 2022-03-31 LAB — RESP PANEL BY RT-PCR (RSV, FLU A&B, COVID)  RVPGX2
Influenza A by PCR: NEGATIVE
Influenza B by PCR: NEGATIVE
Resp Syncytial Virus by PCR: NEGATIVE
SARS Coronavirus 2 by RT PCR: NEGATIVE

## 2022-03-31 LAB — POCT RAPID STREP A (OFFICE): Rapid Strep A Screen: NEGATIVE

## 2022-03-31 MED ORDER — AMOXICILLIN 500 MG PO CAPS
500.0000 mg | ORAL_CAPSULE | Freq: Two times a day (BID) | ORAL | 0 refills | Status: AC
Start: 2022-03-31 — End: 2022-04-10

## 2022-03-31 MED ORDER — FLUCONAZOLE 150 MG PO TABS
150.0000 mg | ORAL_TABLET | Freq: Every day | ORAL | 0 refills | Status: DC
Start: 2022-03-31 — End: 2022-10-19

## 2022-03-31 NOTE — ED Triage Notes (Addendum)
Pt is present today with cough, congestion, sore throat, and HA. Pt sore throat started x2 day ago and other respiratory sx started yesterday

## 2022-03-31 NOTE — ED Provider Notes (Addendum)
[name]    CSN: [id number] Arrival date & time: 03/31/22  9702      History   Chief Complaint Chief Complaint  Patient presents with   Cough   Sore Throat    congestion    HPI Elizabeth Stevenson is a 36 y.o. female.   Patient presents with nasal congestion, cough, and sore throat. She states that her sore throat is significant. Denies any known sick contacts or fevers at home. Denies chest pain, shortness of breath, ear pain, nausea, vomiting, diarrhea, abdominal pain. Has used herbal remedies and tylenol with minimal improvement.    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    Migraine    Ovarian cyst    Pelvic pain    Ventricular tachycardia [facility])     Patient Active Problem List   Diagnosis Date Noted   Obstructive sleep apnea 02/17/2021   Elevated CK 11/29/2016   Chronic daily headache 09/22/2015   Tobacco use disorder 03/23/2015   Tonsil symptom 03/23/2015   Routine general medical examination at a health care facility 03/23/2015   Back pain 03/23/2015   Numbness 08/28/2014   Viral illness 08/28/2014   Morbid obesity (HCC) 02/19/2014   Leg skin lesion, left 01/21/2014   Migraine 02/25/2013   Allergic rhinitis 02/25/2013   Palpitations 04/29/2012   HSV-1 (herpes simplex virus 1) infection     Past Surgical History:  Procedure Laterality Date   COLPOSCOPY  2008   has had procedure done couple times   MOUTH SURGERY      OB History     Gravida  3   Para  1   Term  1   Preterm  0   AB  2   Living  1      SAB  1   IAB  1   Ectopic  0   Multiple  0   Live Births               Home Medications    Prior to Admission medications   Medication Sig Start Date End Date Taking? Authorizing Provider  amoxicillin (AMOXIL) 500 MG  capsule Take 1 capsule (500 mg total) by mouth 2 (two) times daily for 10 days. 03/31/22 04/10/22 Yes Raeford Brandenburg, Acie Fredrickson, FNP  fluconazole (DIFLUCAN) 150 MG tablet Take 1 tablet (150 mg total) by mouth daily. Take at first sign of vaginal yeast 03/31/22  Yes Ilan Kahrs, [place] E, FNP  benzonatate (TESSALON) 100 MG capsule Take 1 capsule (100 mg total) by mouth every 8 (eight) hours as needed for cough. 10/24/21   Lonna Rabold, Acie Fredrickson, FNP  BLACK CURRANT SEED OIL PO Take by mouth.    [provider]  cetirizine (ZYRTEC) 10 MG tablet cetirizine 10 mg tablet    [provider]  Cholecalciferol 1.25 MG (50000 UT) capsule cholecalciferol (vitamin D3) 1,250 mcg (50,000 unit) capsule  TAKE 1 CAPSULE BY MOUTH ONCE A WEEK    [provider]  eletriptan (RELPAX) 40 MG tablet Take 1 tablet (40 mg total) by mouth as needed for migraine or headache. May repeat in 2 hours if headache persists or recurs. 07/21/18   Patel, Roxana Hires K, DO  MAGNESIUM PO Take by mouth.    [provider]  methocarbamol (ROBAXIN) 500 MG tablet Take 1 tablet (500 mg total) by mouth 2 (two) times daily. 07/22/18   Mardella Layman, MD  Multiple Vitamins-Minerals (ZINC PO) Take by mouth.    [provider]  naproxen (NAPROSYN) 500 MG tablet Take 1 tablet (500 mg total) by mouth 2 (two) times daily. 06/25/21   Sabas Sous, MD  oseltamivir (TAMIFLU) 75 MG capsule Take 1 capsule (75 mg total) by mouth every 12 (twelve) hours. Patient not taking: Reported on 10/24/2021 04/04/21   Tomi Bamberger, PA-C  predniSONE (STERAPRED UNI-PAK 21 TAB) 10 MG (21) TBPK tablet Take by mouth daily. Take 6 tabs by mouth daily  for 2 days, then 5 tabs for 2 days, then 4 tabs for 2 days, then 3 tabs for 2 days, 2 tabs for 2 days, then 1 tab by mouth daily for 2 days 10/24/21   Gustavus Bryant, FNP  Turmeric (QC TUMERIC COMPLEX PO) Take by mouth.    [provider]    Family History Family History  Problem Relation Age of Onset    Hypertension Mother        Living   Hyperlipidemia Father    Hypertension Father    Hypertension Maternal Grandmother    Stroke Maternal Grandmother    Diabetes Maternal Grandfather    Diabetes Paternal Grandmother    Healthy Daughter     Social History Social History   Tobacco Use   Smoking status: Former    Types: Cigars    Quit date: 02/11/2014    Years since quitting: 8.1   Smokeless tobacco: Never   Tobacco comments:    quit last week  Vaping Use   Vaping Use: Never used  Substance Use Topics   Alcohol use: Yes    Alcohol/week: 0.0 standard drinks of alcohol    Comment: 1 glass of wine, 2-3 times week   Drug use: No     Allergies   Carbinoxamine and Rondec [chlorpheniramine-pseudoeph]   Review of Systems Review of Systems Per HPI  Physical Exam Triage Vital Signs ED Triage Vitals [03/31/22 0853]  Enc Vitals Group     BP 105/76     Pulse Rate 73     Resp 18     Temp 98.2 F (36.8 C)     Temp src      SpO2 95 %     Weight      Height      Head Circumference      Peak Flow      Pain Score 0     Pain Loc      Pain Edu?      Excl. in GC?    No data found.  Updated Vital Signs BP 105/76   Pulse 73   Temp 98.2 F (36.8 C)   Resp 18   SpO2 95%   Visual Acuity Right Eye Distance:   Left Eye Distance:   Bilateral Distance:    Right Eye Near:   Left Eye Near:    Bilateral Near:     Physical Exam Constitutional:      General: She is not in acute distress.    Appearance: Normal appearance. She is not toxic-appearing or diaphoretic.  HENT:     Head: Normocephalic and atraumatic.     Right Ear: Tympanic membrane and ear canal normal.     Left Ear: Tympanic membrane and ear canal normal.     Nose: Congestion present.  Mouth/Throat:     Mouth: Mucous membranes are moist.     Pharynx: Posterior oropharyngeal erythema present. No pharyngeal swelling or oropharyngeal exudate.     Tonsils: Tonsillar exudate present. No tonsillar  abscesses.  Eyes:     Extraocular Movements: Extraocular movements intact.     Conjunctiva/sclera: Conjunctivae normal.     Pupils: Pupils are equal, round, and reactive to light.  Cardiovascular:     Rate and Rhythm: Normal rate and regular rhythm.     Pulses: Normal pulses.     Heart sounds: Normal heart sounds.  Pulmonary:     Effort: Pulmonary effort is normal. No respiratory distress.     Breath sounds: Normal breath sounds. No stridor. No wheezing, rhonchi or rales.  Abdominal:     General: Abdomen is flat. Bowel sounds are normal.     Palpations: Abdomen is soft.  Musculoskeletal:        General: Normal range of motion.     Cervical back: Normal range of motion.  Skin:    General: Skin is warm and dry.  Neurological:     General: No focal deficit present.     Mental Status: She is alert and oriented to person, place, and time. Mental status is at baseline.  Psychiatric:        Mood and Affect: Mood normal.        Behavior: Behavior normal.      UC Treatments / Results  Labs (all labs ordered are listed, but only abnormal results are displayed) Labs Reviewed  CULTURE, GROUP A STREP ([facility])  RESP PANEL BY RT-PCR (RSV, FLU A&B, COVID)  [id number]  POCT RAPID STREP A (OFFICE)    EKG   Radiology No results found.  Procedures Procedures (including critical care time)  Medications Ordered in UC Medications - No data to display  Initial Impression / Assessment and Plan / UC Course  I have reviewed the triage vital signs and the nursing notes.  Pertinent labs & imaging results that were available during my care of the patient were reviewed by me and considered in my medical decision making (see chart for details).     Rapid strep was negative. Throat culture pending. Despite rapid strep results, I am still suspicious of strep given appearance of posterior pharynx. Will opt to treat with antibiotics. Patient requesting diflucan as she gets yeast infections with  antibiotics. Patient denies that she takes eletriptan so this should be safe to take at first sign of vaginal yeast. Patient requesting to be tested for covid, flu, and RSV as well. Discussed with patent that this could be viral illness as well. Advised patient of supportive care and symptom management. Discussed strict return precautions. Patient verbalized understanding and was agreeable with plan.  After patient was discharged, it appears that she had amoxicillin possibly back in September but she denied to me that she had any recent antibiotics.  Patient was prescribed amoxicillin at this urgent care visit.  After calling the pharmacy, patient has already picked up medications.  Given duration since possible recent antibiotic and the fact that patient may not have actually taken the antibiotic as she denied that she had any recent antibiotics, do think that amoxicillin will be reasonable. Although, she was given strict return precautions if symptoms persist or worsen. Final Clinical Impressions(s) / UC Diagnoses   Final diagnoses:  Acute upper respiratory infection  Sore throat     Discharge Instructions      Rapid strep was negative.  Throat culture, COVID-19, flu, RSV test is pending.  We will call if it is positive.  I am suspicious of strep throat still given the appearance of your throat on exam so I am treating you with an antibiotic.  I have sent this to the pharmacy.  Keep in mind as we discussed that this could also be a viral illness especially if the throat culture is negative.  We will call with results if they are positive.  Please follow-up if symptoms persist or worsen.    ED Prescriptions     Medication Sig Dispense Auth. Provider   amoxicillin (AMOXIL) 500 MG capsule Take 1 capsule (500 mg total) by mouth 2 (two) times daily for 10 days. 20 capsule [place], Meg[place]aley E, [place]   fluconazole (DIFLUCAN) 150 MG tablet Take 1 tablet (150 mg total) by mouth daily. Take at first sign of  vaginal yeast 1 tablet [place], Acie FredricksonHaley E, [place]NP      PDMP not reviewed this encounter.   Gustavus BryantMound, Zlata Alcaide E, [place]NP 03/31/22 1040    [address]Aeon Koors, Sheila Ocasio E, [place] 03/31/22 1048

## 2022-03-31 NOTE — Discharge Instructions (Signed)
Rapid strep was negative.  Throat culture, COVID-19, flu, RSV test is pending.  We will call if it is positive.  I am suspicious of strep throat still given the appearance of your throat on exam so I am treating you with an antibiotic.  I have sent this to the pharmacy.  Keep in mind as we discussed that this could also be a viral illness especially if the throat culture is negative.  We will call with results if they are positive.  Please follow-up if symptoms persist or worsen.

## 2022-04-03 LAB — CULTURE, GROUP A STREP (THRC)

## 2022-04-17 ENCOUNTER — Other Ambulatory Visit: Payer: Self-pay | Admitting: Obstetrics and Gynecology

## 2022-04-17 DIAGNOSIS — N644 Mastodynia: Secondary | ICD-10-CM

## 2022-06-12 ENCOUNTER — Ambulatory Visit
Admission: RE | Admit: 2022-06-12 | Discharge: 2022-06-12 | Disposition: A | Discharge status: Home / Self Care | Payer: Commercial Managed Care - PPO | Source: Ambulatory Visit | Attending: Obstetrics and Gynecology | Admitting: Obstetrics and Gynecology

## 2022-06-12 ENCOUNTER — Ambulatory Visit: Payer: Commercial Managed Care - PPO

## 2022-06-12 DIAGNOSIS — N644 Mastodynia: Secondary | ICD-10-CM

## 2022-10-18 ENCOUNTER — Encounter (HOSPITAL_COMMUNITY): Payer: Self-pay | Admitting: Emergency Medicine

## 2022-10-18 ENCOUNTER — Emergency Department (HOSPITAL_COMMUNITY)
Admission: EM | Admit: 2022-10-18 | Discharge: 2022-10-19 | Disposition: A | Discharge status: Home / Self Care | Payer: Commercial Managed Care - PPO | Attending: Emergency Medicine | Admitting: Emergency Medicine

## 2022-10-18 ENCOUNTER — Other Ambulatory Visit: Payer: Self-pay

## 2022-10-18 DIAGNOSIS — T148XXA Other injury of unspecified body region, initial encounter: Secondary | ICD-10-CM

## 2022-10-18 DIAGNOSIS — W2209XA Striking against other stationary object, initial encounter: Secondary | ICD-10-CM | POA: Diagnosis not present

## 2022-10-18 DIAGNOSIS — S41131A Puncture wound without foreign body of right upper arm, initial encounter: Secondary | ICD-10-CM | POA: Insufficient documentation

## 2022-10-18 DIAGNOSIS — Z23 Encounter for immunization: Secondary | ICD-10-CM | POA: Diagnosis not present

## 2022-10-18 NOTE — ED Triage Notes (Addendum)
Presents for puncture wound to R arm from loose piece of metal from a metal door today. States that metal object was removed completely. Estimates punctured 3in deep.  Tetanus is not up to date (2007 last).

## 2022-10-19 ENCOUNTER — Emergency Department (HOSPITAL_COMMUNITY): Payer: Commercial Managed Care - PPO

## 2022-10-19 MED ORDER — CEPHALEXIN 500 MG PO CAPS: 500.0000 mg | ORAL_CAPSULE | Freq: Two times a day (BID) | ORAL | 0 refills | Status: AC

## 2022-10-19 MED ORDER — TETANUS-DIPHTH-ACELL PERTUSSIS 5-2.5-18.5 LF-MCG/0.5 IM SUSY
0.5000 mL | PREFILLED_SYRINGE | Freq: Once | INTRAMUSCULAR | Status: AC
  Administered 2022-10-19: 0.5 mL via INTRAMUSCULAR
  Filled 2022-10-19: qty 0.5

## 2022-10-19 MED ORDER — FLUCONAZOLE 150 MG PO TABS: ORAL_TABLET | ORAL | 0 refills | Status: AC

## 2022-10-19 MED ORDER — CEPHALEXIN 250 MG PO CAPS
500.0000 mg | ORAL_CAPSULE | Freq: Once | ORAL | Status: AC
  Administered 2022-10-19: 500 mg via ORAL
  Filled 2022-10-19: qty 2

## 2022-10-19 NOTE — Discharge Instructions (Signed)
Apply ice for the first 24 hours.  Then apply warm compresses to help with healing.  If you notice redness, drainage, or fever, return to the ER.

## 2022-10-19 NOTE — ED Provider Notes (Signed)
[facility] Emergency Department Provider Note MRN:  [id number]  Arrival date & time: 10/19/22     Chief Complaint   Puncture Wound   History of Present Illness   Elizabeth Stevenson is a 37 y.o. year-old female presents to the ED with chief complaint of puncture wound to the right arm.  She says she bumped into a piece of metal on a doorway and it pierced her arm about 3 inches.  Bleeding is controlled.  Last tdap unknown.  Denies numbness, weakness, or bleeding.  History provided by patient.   Review of Systems  Pertinent positive and negative review of systems noted in HPI.    Physical Exam   Vitals:   10/18/22 2347  BP: 105/81  Pulse: (!) 113  Resp: 18  Temp: 98.7 F (37.1 C)  SpO2: 97%    CONSTITUTIONAL:  well-appearing, NAD NEURO:  Alert and oriented x 3, CN 3-12 grossly intact EYES:  eyes equal and reactive ENT/NECK:  Supple, no stridor  CARDIO:  appears well-perfused  PULM:  No respiratory distress,  GI/GU:  non-distended,  MSK/SPINE:  No gross deformities, no edema, moves all extremities  SKIN:  no rash, puncture wound through the fatty shallow tissue of the right posterior arm, bleeding is controlled, mild swelling, no obvious FB   *Additional and/or pertinent findings included in MDM below  Diagnostic and Interventional Summary    EKG Interpretation  Date/Time:    Ventricular Rate:    PR Interval:    QRS Duration:   QT Interval:    QTC Calculation:   R Axis:     Text Interpretation:         Labs Reviewed - No data to display  DG Elbow Complete Right    (Results Pending)    Medications  Tdap (BOOSTRIX) injection 0.5 mL (has no administration in time range)  cephALEXin (KEFLEX) capsule 500 mg (has no administration in time range)     Procedures  /  Critical Care Procedures  ED Course and Medical Decision Making  I have reviewed the triage vital signs, the nursing notes, and pertinent available records from the  EMR.  Social Determinants Affecting Complexity of Care: Patient has no clinically significant social determinants affecting this chief complaint..   ED Course:    Medical Decision Making Patient here with puncture wound.  Tdap updated.  Abx to help prevent infection.  X-ray appears negative for FB.  Normal ROM, strength, and sensation.  Appears stable for discharge.  Amount and/or Complexity of Data Reviewed Radiology: ordered and independent interpretation performed.    Details: No obvious metallic FB seen  Risk Prescription drug management.     Consultants: No consultations were needed in caring for this patient.   Treatment and Plan: Emergency department workup does not suggest an emergent condition requiring admission or immediate intervention beyond  what has been performed at this time. The patient is safe for discharge and has  been instructed to return immediately for worsening symptoms, change in  symptoms or any other concerns    Final Clinical Impressions(s) / ED Diagnoses     ICD-10-CM   1. Puncture wound  T14.8XXA       ED Discharge Orders          Ordered    cephALEXin (KEFLEX) 500 MG capsule  2 times daily        10/19/22 0128    fluconazole (DIFLUCAN) 150 MG tablet  10/19/22 0128              Discharge Instructions Discussed with and Provided to Patient:    Discharge Instructions      Apply ice for the first 24 hours.  Then apply warm compresses to help with healing.  If you notice redness, drainage, or fever, return to the ER.      Roxy Horseman, PA-C 10/19/22 0132    Marily Memos, MD 10/19/22 [phone]

## 2023-05-04 ENCOUNTER — Encounter: Payer: Self-pay | Admitting: Emergency Medicine

## 2023-05-04 ENCOUNTER — Ambulatory Visit
Admission: EM | Admit: 2023-05-04 | Discharge: 2023-05-04 | Disposition: A | Discharge status: Home / Self Care | Payer: Commercial Managed Care - PPO | Attending: Physician Assistant | Admitting: Physician Assistant

## 2023-05-04 ENCOUNTER — Other Ambulatory Visit: Payer: Self-pay

## 2023-05-04 DIAGNOSIS — J209 Acute bronchitis, unspecified: Secondary | ICD-10-CM | POA: Diagnosis not present

## 2023-05-04 LAB — POCT URINE PREGNANCY: Preg Test, Ur: NEGATIVE

## 2023-05-04 MED ORDER — PREDNISONE 20 MG PO TABS: 40.0000 mg | ORAL_TABLET | Freq: Every day | ORAL | 0 refills | Status: AC

## 2023-05-04 MED ORDER — AZITHROMYCIN 250 MG PO TABS: 250.0000 mg | ORAL_TABLET | Freq: Every day | ORAL | 0 refills | Status: AC

## 2023-05-04 NOTE — ED Triage Notes (Signed)
Pt here for cough and congestion x 6 days; pt sts some wheezing with hx of bronchitis

## 2023-08-24 ENCOUNTER — Emergency Department (HOSPITAL_COMMUNITY)
Admission: EM | Admit: 2023-08-24 | Discharge: 2023-08-24 | Disposition: A | Discharge status: Home / Self Care | Payer: Self-pay | Attending: Emergency Medicine | Admitting: Emergency Medicine

## 2023-08-24 ENCOUNTER — Other Ambulatory Visit: Payer: Self-pay

## 2023-08-24 DIAGNOSIS — R102 Pelvic and perineal pain: Secondary | ICD-10-CM | POA: Insufficient documentation

## 2023-08-24 DIAGNOSIS — M545 Low back pain, unspecified: Secondary | ICD-10-CM | POA: Insufficient documentation

## 2023-08-24 LAB — URINALYSIS, ROUTINE W REFLEX MICROSCOPIC
Bilirubin Urine: NEGATIVE
Glucose, UA: NEGATIVE mg/dL
Ketones, ur: NEGATIVE mg/dL
Leukocytes,Ua: NEGATIVE
Nitrite: NEGATIVE
Protein, ur: NEGATIVE mg/dL
Specific Gravity, Urine: 1.025 (ref 1.005–1.030)
pH: 6 (ref 5.0–8.0)

## 2023-08-24 LAB — PREGNANCY, URINE: Preg Test, Ur: NEGATIVE

## 2023-08-24 MED ORDER — KETOROLAC TROMETHAMINE 30 MG/ML IJ SOLN
30.0000 mg | Freq: Once | INTRAMUSCULAR | Status: AC
  Administered 2023-08-24: 30 mg via INTRAMUSCULAR
  Filled 2023-08-24: qty 1

## 2023-08-24 MED ORDER — KETOROLAC TROMETHAMINE 10 MG PO TABS: 10.0000 mg | ORAL_TABLET | Freq: Four times a day (QID) | ORAL | 0 refills | Status: AC | PRN

## 2023-08-24 NOTE — ED Provider Notes (Signed)
 [place] [facility]GENCY DEPARTMENT AT [facility] Provider Note   CSN: [id number] Arrival date & time: 08/24/23  1615     History Chief Complaint  Patient presents with   Back Pain   Pelvic Pain    Elizabeth Stevenson is a 38 y.o. female.  Patient without significant medical history presents the emergency department with concerns of right-sided back pain.  She states that she woke up this morning with worsening right-sided back pain.  Endorses some history of chronic low back pain but this feels significantly worse.  States that she is on some relief with laying on her stomach and using a compressive waist dressing.  She denies any dysuria, or gross hematuria.  No history of kidney stones.   Back Pain Associated symptoms: pelvic pain   Pelvic Pain       Home Medications Prior to Admission medications   Medication Sig Start Date End Date Taking? Authorizing Provider  ketorolac (TORADOL) 10 MG tablet Take 1 tablet (10 mg total) by mouth every 6 (six) hours as needed. 08/24/23  Yes Abiel Antrim A, PA-C  azithromycin (ZITHROMAX) 250 MG tablet Take 1 tablet (250 mg total) by mouth daily. Take first 2 tablets together, then 1 every day until finished. 05/04/23   Vernestine Gondola, PA-C  benzonatate (TESSALON) 100 MG capsule Take 1 capsule (100 mg total) by mouth every 8 (eight) hours as needed for cough. 10/24/21   Mound, Dewey Fordyce, FNP  BLACK CURRANT SEED OIL PO Take by mouth.    [provider]  cephALEXin (KEFLEX) 500 MG capsule Take 1 capsule (500 mg total) by mouth 2 (two) times daily. Patient not taking: Reported on 05/04/2023 10/19/22   Sherel Dikes, PA-C  cetirizine (ZYRTEC) 10 MG tablet cetirizine 10 mg tablet    [provider]  Cholecalciferol 1.25 MG (50000 UT) capsule cholecalciferol (vitamin D3) 1,250 mcg (50,000 unit) capsule  TAKE 1 CAPSULE BY MOUTH ONCE A WEEK    [provider]  eletriptan (RELPAX) 40 MG tablet Take 1 tablet (40 mg  total) by mouth as needed for migraine or headache. May repeat in 2 hours if headache persists or recurs. 07/21/18   Patel, Donika K, DO  fluconazole (DIFLUCAN) 150 MG tablet Take 1 tablet after completing the antibiotic.  Then take 1 tablet 1 week later if having vaginal itching. Patient not taking: Reported on 05/04/2023 10/19/22   Sherel Dikes, PA-C  MAGNESIUM PO Take by mouth.    [provider]  methocarbamol (ROBAXIN) 500 MG tablet Take 1 tablet (500 mg total) by mouth 2 (two) times daily. 07/22/18   Afton Albright, MD  Multiple Vitamins-Minerals (ZINC PO) Take by mouth.    [provider]  naproxen (NAPROSYN) 500 MG tablet Take 1 tablet (500 mg total) by mouth 2 (two) times daily. 06/25/21   Edson Graces, MD  oseltamivir (TAMIFLU) 75 MG capsule Take 1 capsule (75 mg total) by mouth every 12 (twelve) hours. Patient not taking: Reported on 10/24/2021 04/04/21   Vernestine Gondola, PA-C  Turmeric (QC TUMERIC COMPLEX PO) Take by mouth.    [provider]      Allergies    Carbinoxamine and Rondec [chlorpheniramine-pseudoeph]    Review of Systems   Review of Systems  Genitourinary:  Positive for pelvic pain.  Musculoskeletal:  Positive for back pain.  All other systems reviewed and are negative.   Physical Exam Updated Vital Signs BP (!) 135/94   Pulse 92  Temp 97.9 F (36.6 C) (Oral)   Resp 18   Ht 5\' 4"  (1.626 m)   Wt 117.9 kg   LMP 08/24/2023   SpO2 100%   BMI 44.63 kg/m  Physical Exam Vitals and nursing note reviewed.  Constitutional:      General: She is not in acute distress.    Appearance: She is well-developed.  HENT:     Head: Normocephalic and atraumatic.  Eyes:     Conjunctiva/sclera: Conjunctivae normal.  Cardiovascular:     Rate and Rhythm: Normal rate and regular rhythm.     Heart sounds: No murmur heard. Pulmonary:     Effort: Pulmonary effort is normal. No respiratory distress.     Breath sounds: Normal breath sounds.   Abdominal:     Palpations: Abdomen is soft.     Tenderness: There is no abdominal tenderness. There is no right CVA tenderness or left CVA tenderness.  Musculoskeletal:        General: Tenderness present. No swelling.       Arms:     Cervical back: Neck supple.  Skin:    General: Skin is warm and dry.     Capillary Refill: Capillary refill takes less than 2 seconds.  Neurological:     Mental Status: She is alert.  Psychiatric:        Mood and Affect: Mood normal.     ED Results / Procedures / Treatments   Labs (all labs ordered are listed, but only abnormal results are displayed) Labs Reviewed  URINALYSIS, ROUTINE W REFLEX MICROSCOPIC - Abnormal; Notable for the following components:      Result Value   APPearance HAZY (*)    Hgb urine dipstick SMALL (*)    Bacteria, UA RARE (*)    All other components within normal limits  PREGNANCY, URINE    EKG None  Radiology No results found.  Procedures Procedures    Medications Ordered in ED Medications  ketorolac (TORADOL) 30 MG/ML injection 30 mg (30 mg Intramuscular Given 08/24/23 1845)    ED Course/ Medical Decision Making/ A&P                                 Medical Decision Making Amount and/or Complexity of Data Reviewed Labs: ordered.  Risk Prescription drug management.   This patient presents to the ED for concern of back pain.  Differential diagnosis includes sciatica, pyelonephritis, urolithiasis   Lab Tests:  I Ordered, and personally interpreted labs.  The pertinent results include: Urinalysis with some hemoglobin and bacteria but no evidence of infection, urine pregnancy negative   Medicines ordered and prescription drug management:  I ordered medication including Toradol for pain Reevaluation of the patient after these medicines showed that the patient improved I have reviewed the patients home medicines and have made adjustments as needed   Problem List / ED Course:  Patient with past  history significant for chronic back pain presents to the ED today with concerns of right sided low back pain. Denies any recent injury or trauma to the area. States that she has a hard time getting comfortable and only feels comfortable laying on her stomach. No nausea, vomiting, diarrhea, fever, chills, or bodyaches. No history of kidney stones. Physical exam is reassuring but there is notable right hip tenderness. No CVA tenderness. Abdomen is non-tender on palpation but worsens on the right lower side with movement. Will obtain UA and  pregnancy for assessment of pain. Toradol ordered for pain. Patient requesting MRI of back. UA with no UTI but some blood seen. Patient states she has been spotting so unclear if this is due to menstruation. Advised that CT could help distinguish between these but patient would prefer to wait as toradol has greatly alleviated her pain. Advised return precautions if visible hematuria presents, fevers, or other concerning symptoms. Otherwise stable and discharged home with plans for outpatient follow up.  Final Clinical Impression(s) / ED Diagnoses Final diagnoses:  Acute right-sided low back pain without sciatica    Rx / DC Orders ED Discharge Orders          Ordered    ketorolac (TORADOL) 10 MG tablet  Every 6 hours PRN        08/24/23 1935              Minerva Bluett A, PA-C 08/24/23 1941    Horton, Kristie M, DO 08/25/23 1313

## 2023-08-24 NOTE — Discharge Instructions (Addendum)
 You were seen in the ER today for concerns of back pain. Your urine did have some blood in it which could be due to menstruation or possible blood in your urinary tract from a stone or other reasons. If you start peeing mostly blood or start having fevers, return to the ER.

## 2023-08-24 NOTE — ED Triage Notes (Signed)
 Patient to ED by POV with c/o back/pelvic pain. Per patient she woke up yesterday with right lower back pain that radiates into her pelvic area. States placing a pillow and laying on belly helps with pain. She denies dysuria or bowel issues. Fell two weeks ago but injury to face no back injuries to cause pain.

## 2024-04-23 ENCOUNTER — Emergency Department (HOSPITAL_COMMUNITY): Payer: Self-pay

## 2024-04-23 ENCOUNTER — Emergency Department (HOSPITAL_COMMUNITY)
Admission: EM | Admit: 2024-04-23 | Discharge: 2024-04-24 | Discharge status: Against Medical Advice | Payer: Self-pay | Attending: Emergency Medicine | Admitting: Emergency Medicine

## 2024-04-23 ENCOUNTER — Other Ambulatory Visit: Payer: Self-pay

## 2024-04-23 DIAGNOSIS — R0602 Shortness of breath: Secondary | ICD-10-CM | POA: Insufficient documentation

## 2024-04-23 DIAGNOSIS — R519 Headache, unspecified: Secondary | ICD-10-CM | POA: Insufficient documentation

## 2024-04-23 DIAGNOSIS — Z5321 Procedure and treatment not carried out due to patient leaving prior to being seen by health care provider: Secondary | ICD-10-CM | POA: Insufficient documentation

## 2024-04-23 DIAGNOSIS — R079 Chest pain, unspecified: Secondary | ICD-10-CM | POA: Insufficient documentation

## 2024-04-23 LAB — HCG, SERUM, QUALITATIVE: Preg, Serum: NEGATIVE

## 2024-04-23 LAB — TROPONIN I (HIGH SENSITIVITY): Troponin I (High Sensitivity): <2 ng/L (ref <18)

## 2024-04-23 LAB — BASIC METABOLIC PANEL WITH GFR
Anion gap: 6 (ref 5–15)
BUN: 8 mg/dL (ref 6–20)
CO2: 27 mmol/L (ref 22–32)
Calcium: 8.2 mg/dL — ABNORMAL LOW (ref 8.9–10.3)
Chloride: 103 mmol/L (ref 98–111)
Creatinine, Ser: 0.99 mg/dL (ref 0.44–1.00)
GFR, Estimated: >60 mL/min (ref >60)
Glucose, Bld: 100 mg/dL — ABNORMAL HIGH (ref 70–99)
Potassium: 3.8 mmol/L (ref 3.5–5.1)
Sodium: 136 mmol/L (ref 135–145)

## 2024-04-23 LAB — CBC
HCT: 37.5 % (ref 36.0–46.0)
Hemoglobin: 12.4 g/dL (ref 12.0–15.0)
MCH: 32 pg (ref 26.0–34.0)
MCHC: 33.1 g/dL (ref 30.0–36.0)
MCV: 96.9 fL (ref 80.0–100.0)
Platelets: 276 K/uL (ref 150–400)
RBC: 3.87 MIL/uL (ref 3.87–5.11)
RDW: 12.8 % (ref 11.5–15.5)
WBC: 7.2 K/uL (ref 4.0–10.5)
nRBC: 0 % (ref 0.0–0.2)

## 2024-04-23 NOTE — ED Triage Notes (Signed)
 Pt  c/o CP/SOB; pain described as dull/pressure, radiating to the left under the breast.  Pt reports having hx of SVT and has seen by the cardiologist. Pt also states she has had a constant pressure on the right side of her head that gets worse when laying down.

## 2024-04-24 LAB — TROPONIN I (HIGH SENSITIVITY): Troponin I (High Sensitivity): <2 ng/L (ref <18)

## 2024-04-24 NOTE — ED Notes (Signed)
 Pt stated that she couldn't wait and left ed
